# Patient Record
Sex: Female | Born: 1980 | Race: White | Hispanic: No | Marital: Married | Smoking: Never smoker
Health system: Southern US, Community
[De-identification: ages and names within clinical notes are randomized; demographics above are authoritative.]

## PROBLEM LIST (undated history)

## (undated) DIAGNOSIS — R519 Headache, unspecified: Secondary | ICD-10-CM

## (undated) DIAGNOSIS — F419 Anxiety disorder, unspecified: Secondary | ICD-10-CM

## (undated) DIAGNOSIS — M25859 Other specified joint disorders, unspecified hip: Secondary | ICD-10-CM

## (undated) DIAGNOSIS — R112 Nausea with vomiting, unspecified: Secondary | ICD-10-CM

## (undated) DIAGNOSIS — Z9889 Other specified postprocedural states: Secondary | ICD-10-CM

## (undated) DIAGNOSIS — J40 Bronchitis, not specified as acute or chronic: Secondary | ICD-10-CM

## (undated) DIAGNOSIS — R51 Headache: Secondary | ICD-10-CM

---

## 2017-02-20 DIAGNOSIS — G5601 Carpal tunnel syndrome, right upper limb: Secondary | ICD-10-CM

## 2017-02-20 HISTORY — PX: CARPAL TUNNEL RELEASE: SHX101

## 2017-02-20 HISTORY — DX: Carpal tunnel syndrome, right upper limb: G56.01

## 2017-11-01 ENCOUNTER — Other Ambulatory Visit: Payer: Self-pay | Admitting: Orthopedic Surgery

## 2017-11-01 DIAGNOSIS — M76892 Other specified enthesopathies of left lower limb, excluding foot: Secondary | ICD-10-CM

## 2017-11-16 ENCOUNTER — Ambulatory Visit
Admission: RE | Admit: 2017-11-16 | Discharge: 2017-11-16 | Disposition: A | Discharge status: Home / Self Care | Payer: BC Managed Care – PPO | Source: Ambulatory Visit | Attending: Orthopedic Surgery | Admitting: Orthopedic Surgery

## 2017-11-16 DIAGNOSIS — M76892 Other specified enthesopathies of left lower limb, excluding foot: Secondary | ICD-10-CM | POA: Diagnosis not present

## 2017-12-18 ENCOUNTER — Inpatient Hospital Stay: Admission: RE | Admit: 2017-12-18 | Payer: BC Managed Care – PPO | Source: Ambulatory Visit

## 2017-12-19 ENCOUNTER — Other Ambulatory Visit: Payer: Self-pay

## 2017-12-19 ENCOUNTER — Encounter
Admission: RE | Admit: 2017-12-19 | Discharge: 2017-12-19 | Disposition: A | Discharge status: Home / Self Care | Payer: BC Managed Care – PPO | Source: Ambulatory Visit | Attending: Orthopedic Surgery | Admitting: Orthopedic Surgery

## 2017-12-19 DIAGNOSIS — Z01812 Encounter for preprocedural laboratory examination: Secondary | ICD-10-CM | POA: Insufficient documentation

## 2017-12-19 HISTORY — DX: Other specified postprocedural states: R11.2

## 2017-12-19 HISTORY — DX: Headache, unspecified: R51.9

## 2017-12-19 HISTORY — DX: Other specified postprocedural states: Z98.890

## 2017-12-19 HISTORY — DX: Anxiety disorder, unspecified: F41.9

## 2017-12-19 HISTORY — DX: Headache: R51

## 2017-12-19 LAB — CBC
HCT: 42.1 % (ref 36.0–46.0)
Hemoglobin: 14.2 g/dL (ref 12.0–15.0)
MCH: 31.4 pg (ref 26.0–34.0)
MCHC: 33.7 g/dL (ref 30.0–36.0)
MCV: 93.1 fL (ref 80.0–100.0)
Platelets: 331 10*3/uL (ref 150–400)
RBC: 4.52 MIL/uL (ref 3.87–5.11)
RDW: 12 % (ref 11.5–15.5)
WBC: 6.8 10*3/uL (ref 4.0–10.5)
nRBC: 0 % (ref 0.0–0.2)

## 2017-12-19 NOTE — Patient Instructions (Signed)
Your procedure is scheduled on: Monday 12/24/17 Report to [place]. To find out your arrival time please call [phone] between 1PM - 3PM on Friday 12/21/17.  Remember: Instructions that are not followed completely may result in serious medical risk, up to and including death, or upon the discretion of your surgeon and anesthesiologist your surgery may need to be rescheduled.     _X__ 1. Do not eat food after midnight the night before your procedure.                 No gum chewing or hard candies. You may drink clear liquids up to 2 hours                 before you are scheduled to arrive for your surgery- DO not drink clear                 liquids within 2 hours of the start of your surgery.                 Clear Liquids include:  water, apple juice without pulp, clear carbohydrate                 drink such as Clearfast or Gatorade, Black Coffee or Tea (Do not add                 anything to coffee or tea).  __X__2.  On the morning of surgery brush your teeth with toothpaste and water, you                 may rinse your mouth with mouthwash if you wish.  Do not swallow any              toothpaste of mouthwash.     _X__ 3.  No Alcohol for 24 hours before or after surgery.   _X__ 4.  Do Not Smoke or use e-cigarettes For 24 Hours Prior to Your Surgery.                 Do not use any chewable tobacco products for at least 6 hours prior to                 surgery.  ____  5.  Bring all medications with you on the day of surgery if instructed.   __X__  6.  Notify your doctor if there is any change in your medical condition      (cold, fever, infections).     Do not wear jewelry, make-up, hairpins, clips or nail polish. Do not wear lotions, powders, or perfumes.  Do not shave 48 hours prior to surgery. Men may shave face and neck. Do not bring valuables to the hospital.    [facility] is not responsible for any belongings or  valuables.  Contacts, dentures/partials or body piercings may not be worn into surgery. Bring a case for your contacts, glasses or hearing aids, a denture cup will be supplied. Leave your suitcase in the car. After surgery it may be brought to your room. For patients admitted to the hospital, discharge time is determined by your treatment team.   Patients discharged the day of surgery will not be allowed to drive home.   Please read over the following fact sheets that you were given:   MRSA Information  __X__ Take these medicines the morning of surgery with A SIP OF WATER:  1. cetirizine (ZYRTEC)  2.   3.   4.  5.  6.  ____ Fleet Enema (as directed)   __X__ Use CHG Soap/SAGE wipes as directed  ____ Use inhalers on the day of surgery  ____ Stop metformin/Janumet/Farxiga 2 days prior to surgery    ____ Take 1/2 of usual insulin dose the night before surgery. No insulin the morning          of surgery.   ____ Stop Blood Thinners Coumadin/Plavix/Xarelto/Pleta/Pradaxa/Eliquis/Effient/Aspirin  on   Or contact your Surgeon, Cardiologist or Medical Doctor regarding  ability to stop your blood thinners  __X__ Stop Anti-inflammatories 7 days before surgery such as Advil, Ibuprofen, Motrin,  BC or Goodies Powder, Naprosyn, Naproxen, Aleve, Aspirin (you may take tylenol if needed)   __X__ Stop all herbal supplements, fish oil or vitamin E until after surgery.    ____ Bring C-Pap to the hospital.

## 2017-12-23 MED ORDER — CEFAZOLIN SODIUM-DEXTROSE 2-4 GM/100ML-% IV SOLN
2.0000 g | Freq: Once | INTRAVENOUS | Status: AC
  Administered 2017-12-24 (×2): 2 g via INTRAVENOUS

## 2017-12-24 ENCOUNTER — Encounter
Admission: RE | Disposition: A | Discharge status: Home / Self Care | Payer: Self-pay | Source: Ambulatory Visit | Attending: Orthopedic Surgery

## 2017-12-24 ENCOUNTER — Encounter: Payer: Self-pay | Admitting: Emergency Medicine

## 2017-12-24 ENCOUNTER — Observation Stay
Admission: RE | Admit: 2017-12-24 | Discharge: 2017-12-25 | Disposition: A | Discharge status: Home / Self Care | Payer: BC Managed Care – PPO | Source: Ambulatory Visit | Attending: Orthopedic Surgery | Admitting: Orthopedic Surgery

## 2017-12-24 ENCOUNTER — Ambulatory Visit: Payer: BC Managed Care – PPO | Admitting: Certified Registered"

## 2017-12-24 ENCOUNTER — Observation Stay: Payer: BC Managed Care – PPO

## 2017-12-24 ENCOUNTER — Other Ambulatory Visit: Payer: Self-pay

## 2017-12-24 DIAGNOSIS — X58XXXA Exposure to other specified factors, initial encounter: Secondary | ICD-10-CM | POA: Diagnosis not present

## 2017-12-24 DIAGNOSIS — Z7982 Long term (current) use of aspirin: Secondary | ICD-10-CM | POA: Diagnosis not present

## 2017-12-24 DIAGNOSIS — M769 Unspecified enthesopathy, lower limb, excluding foot: Secondary | ICD-10-CM | POA: Insufficient documentation

## 2017-12-24 DIAGNOSIS — M25859 Other specified joint disorders, unspecified hip: Secondary | ICD-10-CM | POA: Diagnosis present

## 2017-12-24 DIAGNOSIS — Z419 Encounter for procedure for purposes other than remedying health state, unspecified: Secondary | ICD-10-CM

## 2017-12-24 DIAGNOSIS — M25852 Other specified joint disorders, left hip: Secondary | ICD-10-CM | POA: Insufficient documentation

## 2017-12-24 DIAGNOSIS — S73192A Other sprain of left hip, initial encounter: Principal | ICD-10-CM | POA: Insufficient documentation

## 2017-12-24 DIAGNOSIS — Z79899 Other long term (current) drug therapy: Secondary | ICD-10-CM | POA: Diagnosis not present

## 2017-12-24 HISTORY — PX: HIP ARTHROSCOPY: SHX668

## 2017-12-24 LAB — POCT PREGNANCY, URINE: Preg Test, Ur: NEGATIVE

## 2017-12-24 SURGERY — ARTHROSCOPY HIP
Anesthesia: General | Site: Hip | Laterality: Left

## 2017-12-24 MED ORDER — LIDOCAINE HCL (PF) 1 % IJ SOLN
INTRAMUSCULAR | Status: DC | PRN
  Administered 2017-12-24: 20 mL

## 2017-12-24 MED ORDER — SODIUM CHLORIDE 0.9 % IV SOLN
INTRAVENOUS | Status: DC
  Administered 2017-12-24 – 2017-12-25 (×2): via INTRAVENOUS

## 2017-12-24 MED ORDER — SCOPOLAMINE 1 MG/3DAYS TD PT72
1.0000 | MEDICATED_PATCH | Freq: Once | TRANSDERMAL | Status: DC
  Administered 2017-12-24: 1.5 mg via TRANSDERMAL

## 2017-12-24 MED ORDER — ADULT MULTIVITAMIN W/MINERALS CH
1.0000 | ORAL_TABLET | Freq: Every day | ORAL | Status: DC
  Administered 2017-12-25: 1 via ORAL
  Filled 2017-12-24: qty 1

## 2017-12-24 MED ORDER — NAPROXEN 500 MG PO TABS
500.0000 mg | ORAL_TABLET | Freq: Two times a day (BID) | ORAL | Status: DC
  Administered 2017-12-25: 500 mg via ORAL
  Filled 2017-12-24 (×2): qty 1

## 2017-12-24 MED ORDER — ACETAMINOPHEN 500 MG PO TABS
ORAL_TABLET | ORAL | Status: AC
  Administered 2017-12-24: 1000 mg via ORAL
  Filled 2017-12-24: qty 2

## 2017-12-24 MED ORDER — MORPHINE SULFATE (PF) 4 MG/ML IV SOLN
INTRAVENOUS | Status: AC
  Filled 2017-12-24: qty 1

## 2017-12-24 MED ORDER — TIZANIDINE HCL 4 MG PO TABS
8.0000 mg | ORAL_TABLET | Freq: Four times a day (QID) | ORAL | Status: DC | PRN
  Filled 2017-12-24: qty 2

## 2017-12-24 MED ORDER — TRAMADOL HCL 50 MG PO TABS
50.0000 mg | ORAL_TABLET | Freq: Four times a day (QID) | ORAL | Status: DC | PRN
  Administered 2017-12-24 (×2): 50 mg via ORAL
  Filled 2017-12-24 (×2): qty 1

## 2017-12-24 MED ORDER — MORPHINE SULFATE (PF) 4 MG/ML IV SOLN
INTRAVENOUS | Status: DC | PRN
  Administered 2017-12-24: 4 mg

## 2017-12-24 MED ORDER — DIPHENHYDRAMINE HCL 12.5 MG/5ML PO ELIX: 12.5000 mg | ORAL_SOLUTION | ORAL | Status: DC | PRN

## 2017-12-24 MED ORDER — ONDANSETRON HCL 4 MG/2ML IJ SOLN
INTRAMUSCULAR | Status: DC | PRN
  Administered 2017-12-24: 4 mg via INTRAVENOUS

## 2017-12-24 MED ORDER — ROCURONIUM BROMIDE 50 MG/5ML IV SOLN
INTRAVENOUS | Status: AC
  Filled 2017-12-24: qty 1

## 2017-12-24 MED ORDER — SCOPOLAMINE 1 MG/3DAYS TD PT72
MEDICATED_PATCH | TRANSDERMAL | Status: AC
  Administered 2017-12-24: 1.5 mg via TRANSDERMAL
  Filled 2017-12-24: qty 1

## 2017-12-24 MED ORDER — ONDANSETRON HCL 4 MG PO TABS: 4.0000 mg | ORAL_TABLET | Freq: Four times a day (QID) | ORAL | Status: DC | PRN

## 2017-12-24 MED ORDER — GLYCOPYRROLATE 0.2 MG/ML IJ SOLN
INTRAMUSCULAR | Status: AC
  Filled 2017-12-24: qty 1

## 2017-12-24 MED ORDER — GABAPENTIN 300 MG PO CAPS
300.0000 mg | ORAL_CAPSULE | Freq: Once | ORAL | Status: AC
  Administered 2017-12-24: 300 mg via ORAL

## 2017-12-24 MED ORDER — PANTOPRAZOLE SODIUM 20 MG PO TBEC
20.0000 mg | DELAYED_RELEASE_TABLET | Freq: Every day | ORAL | Status: DC
  Administered 2017-12-25: 20 mg via ORAL
  Filled 2017-12-24: qty 1

## 2017-12-24 MED ORDER — ONDANSETRON HCL 4 MG/2ML IJ SOLN
INTRAMUSCULAR | Status: AC
  Filled 2017-12-24: qty 2

## 2017-12-24 MED ORDER — LIDOCAINE HCL (CARDIAC) PF 100 MG/5ML IV SOSY
PREFILLED_SYRINGE | INTRAVENOUS | Status: DC | PRN
  Administered 2017-12-24: 50 mg via INTRAVENOUS

## 2017-12-24 MED ORDER — DEXAMETHASONE SODIUM PHOSPHATE 10 MG/ML IJ SOLN
INTRAMUSCULAR | Status: AC
  Filled 2017-12-24: qty 1

## 2017-12-24 MED ORDER — FENTANYL CITRATE (PF) 250 MCG/5ML IJ SOLN
INTRAMUSCULAR | Status: AC
  Filled 2017-12-24: qty 5

## 2017-12-24 MED ORDER — MIDAZOLAM HCL 2 MG/2ML IJ SOLN
INTRAMUSCULAR | Status: AC
  Filled 2017-12-24: qty 2

## 2017-12-24 MED ORDER — SENNOSIDES-DOCUSATE SODIUM 8.6-50 MG PO TABS: 1.0000 | ORAL_TABLET | Freq: Every evening | ORAL | Status: DC | PRN

## 2017-12-24 MED ORDER — OXYCODONE HCL 5 MG PO TABS
10.0000 mg | ORAL_TABLET | ORAL | Status: DC | PRN
  Administered 2017-12-25: 10 mg via ORAL
  Filled 2017-12-24 (×2): qty 2

## 2017-12-24 MED ORDER — PHENYLEPHRINE HCL 10 MG/ML IJ SOLN
INTRAMUSCULAR | Status: AC
  Filled 2017-12-24: qty 1

## 2017-12-24 MED ORDER — HYDROMORPHONE HCL 1 MG/ML IJ SOLN: 0.5000 mg | INTRAMUSCULAR | Status: DC | PRN

## 2017-12-24 MED ORDER — KETOROLAC TROMETHAMINE 15 MG/ML IJ SOLN
15.0000 mg | Freq: Once | INTRAMUSCULAR | Status: AC
  Administered 2017-12-24: 15 mg via INTRAVENOUS
  Filled 2017-12-24: qty 1

## 2017-12-24 MED ORDER — GABAPENTIN 300 MG PO CAPS
ORAL_CAPSULE | ORAL | Status: AC
  Administered 2017-12-24: 300 mg via ORAL
  Filled 2017-12-24: qty 1

## 2017-12-24 MED ORDER — CEFAZOLIN SODIUM-DEXTROSE 2-4 GM/100ML-% IV SOLN
INTRAVENOUS | Status: AC
  Filled 2017-12-24: qty 100

## 2017-12-24 MED ORDER — LACTATED RINGERS IV SOLN
INTRAVENOUS | Status: DC
  Administered 2017-12-24 (×2): via INTRAVENOUS

## 2017-12-24 MED ORDER — METHOCARBAMOL 500 MG PO TABS
ORAL_TABLET | ORAL | Status: AC
  Administered 2017-12-24: 1000 mg via ORAL
  Filled 2017-12-24: qty 2

## 2017-12-24 MED ORDER — CELECOXIB 200 MG PO CAPS
200.0000 mg | ORAL_CAPSULE | Freq: Once | ORAL | Status: AC
  Administered 2017-12-24: 200 mg via ORAL

## 2017-12-24 MED ORDER — ONDANSETRON HCL 4 MG/2ML IJ SOLN: 4.0000 mg | Freq: Once | INTRAMUSCULAR | Status: DC | PRN

## 2017-12-24 MED ORDER — PHENYLEPHRINE HCL 10 MG/ML IJ SOLN
INTRAMUSCULAR | Status: DC | PRN
  Administered 2017-12-24: 100 ug via INTRAVENOUS
  Administered 2017-12-24: 50 ug via INTRAVENOUS
  Administered 2017-12-24: 100 ug via INTRAVENOUS

## 2017-12-24 MED ORDER — CEFAZOLIN SODIUM-DEXTROSE 2-4 GM/100ML-% IV SOLN
2.0000 g | Freq: Four times a day (QID) | INTRAVENOUS | Status: AC
  Administered 2017-12-24 – 2017-12-25 (×2): 2 g via INTRAVENOUS
  Filled 2017-12-24 (×2): qty 100

## 2017-12-24 MED ORDER — DOCUSATE SODIUM 100 MG PO CAPS
100.0000 mg | ORAL_CAPSULE | Freq: Two times a day (BID) | ORAL | Status: DC
  Administered 2017-12-24 – 2017-12-25 (×2): 100 mg via ORAL
  Filled 2017-12-24 (×2): qty 1

## 2017-12-24 MED ORDER — FAMOTIDINE 20 MG PO TABS
20.0000 mg | ORAL_TABLET | Freq: Once | ORAL | Status: AC
  Administered 2017-12-24: 20 mg via ORAL

## 2017-12-24 MED ORDER — CELECOXIB 200 MG PO CAPS
ORAL_CAPSULE | ORAL | Status: AC
  Administered 2017-12-24: 200 mg via ORAL
  Filled 2017-12-24: qty 1

## 2017-12-24 MED ORDER — ROPIVACAINE HCL 5 MG/ML IJ SOLN
INTRAMUSCULAR | Status: AC
  Filled 2017-12-24: qty 20

## 2017-12-24 MED ORDER — LIDOCAINE HCL (PF) 2 % IJ SOLN
INTRAMUSCULAR | Status: AC
  Filled 2017-12-24: qty 10

## 2017-12-24 MED ORDER — MIDAZOLAM HCL 2 MG/2ML IJ SOLN
INTRAMUSCULAR | Status: DC | PRN
  Administered 2017-12-24: 2 mg via INTRAVENOUS

## 2017-12-24 MED ORDER — FENTANYL CITRATE (PF) 100 MCG/2ML IJ SOLN
25.0000 ug | INTRAMUSCULAR | Status: DC | PRN
  Administered 2017-12-24: 25 ug via INTRAVENOUS

## 2017-12-24 MED ORDER — PROPOFOL 10 MG/ML IV BOLUS
INTRAVENOUS | Status: DC | PRN
  Administered 2017-12-24: 150 mg via INTRAVENOUS

## 2017-12-24 MED ORDER — KETOROLAC TROMETHAMINE 15 MG/ML IJ SOLN
INTRAMUSCULAR | Status: AC
  Filled 2017-12-24: qty 1

## 2017-12-24 MED ORDER — OXYCODONE HCL 5 MG PO TABS
5.0000 mg | ORAL_TABLET | ORAL | Status: DC | PRN
  Administered 2017-12-24 – 2017-12-25 (×2): 5 mg via ORAL
  Filled 2017-12-24 (×2): qty 1

## 2017-12-24 MED ORDER — EPHEDRINE SULFATE 50 MG/ML IJ SOLN
INTRAMUSCULAR | Status: AC
  Filled 2017-12-24: qty 1

## 2017-12-24 MED ORDER — ROCURONIUM BROMIDE 100 MG/10ML IV SOLN
INTRAVENOUS | Status: DC | PRN
  Administered 2017-12-24: 50 mg via INTRAVENOUS
  Administered 2017-12-24: 10 mg via INTRAVENOUS

## 2017-12-24 MED ORDER — EPHEDRINE SULFATE 50 MG/ML IJ SOLN
INTRAMUSCULAR | Status: DC | PRN
  Administered 2017-12-24: 10 mg via INTRAVENOUS

## 2017-12-24 MED ORDER — FENTANYL CITRATE (PF) 100 MCG/2ML IJ SOLN
INTRAMUSCULAR | Status: AC
  Filled 2017-12-24: qty 2

## 2017-12-24 MED ORDER — ACETAMINOPHEN 500 MG PO TABS
1000.0000 mg | ORAL_TABLET | Freq: Once | ORAL | Status: AC
  Administered 2017-12-24: 1000 mg via ORAL

## 2017-12-24 MED ORDER — PROPOFOL 10 MG/ML IV BOLUS
INTRAVENOUS | Status: AC
  Filled 2017-12-24: qty 20

## 2017-12-24 MED ORDER — CEFAZOLIN SODIUM 1 G IJ SOLR
INTRAMUSCULAR | Status: AC
  Filled 2017-12-24: qty 20

## 2017-12-24 MED ORDER — EPINEPHRINE 30 MG/30ML IJ SOLN
INTRAMUSCULAR | Status: AC
  Filled 2017-12-24: qty 1

## 2017-12-24 MED ORDER — METHOCARBAMOL 500 MG PO TABS
1000.0000 mg | ORAL_TABLET | Freq: Once | ORAL | Status: AC
  Administered 2017-12-24: 1000 mg via ORAL
  Filled 2017-12-24: qty 2

## 2017-12-24 MED ORDER — GLYCOPYRROLATE 0.2 MG/ML IJ SOLN
INTRAMUSCULAR | Status: DC | PRN
  Administered 2017-12-24: 0.2 mg via INTRAVENOUS

## 2017-12-24 MED ORDER — FENTANYL CITRATE (PF) 100 MCG/2ML IJ SOLN
INTRAMUSCULAR | Status: DC | PRN
  Administered 2017-12-24 (×4): 50 ug via INTRAVENOUS

## 2017-12-24 MED ORDER — SERTRALINE HCL 50 MG PO TABS
25.0000 mg | ORAL_TABLET | Freq: Every day | ORAL | Status: DC
  Administered 2017-12-24: 25 mg via ORAL
  Filled 2017-12-24: qty 1

## 2017-12-24 MED ORDER — ROPIVACAINE HCL 5 MG/ML IJ SOLN
INTRAMUSCULAR | Status: DC | PRN
  Administered 2017-12-24: 20 mL

## 2017-12-24 MED ORDER — ONDANSETRON HCL 4 MG/2ML IJ SOLN: 4.0000 mg | Freq: Four times a day (QID) | INTRAMUSCULAR | Status: DC | PRN

## 2017-12-24 MED ORDER — LORATADINE 10 MG PO TABS
10.0000 mg | ORAL_TABLET | Freq: Every day | ORAL | Status: DC
  Administered 2017-12-25: 10 mg via ORAL
  Filled 2017-12-24: qty 1

## 2017-12-24 MED ORDER — FAMOTIDINE 20 MG PO TABS
ORAL_TABLET | ORAL | Status: AC
  Administered 2017-12-24: 20 mg via ORAL
  Filled 2017-12-24: qty 1

## 2017-12-24 MED ORDER — ACETAMINOPHEN 500 MG PO TABS
1000.0000 mg | ORAL_TABLET | Freq: Three times a day (TID) | ORAL | Status: DC
  Administered 2017-12-24: 1000 mg via ORAL
  Filled 2017-12-24: qty 2

## 2017-12-24 MED ORDER — ASPIRIN EC 325 MG PO TBEC
325.0000 mg | DELAYED_RELEASE_TABLET | Freq: Every day | ORAL | Status: DC
  Administered 2017-12-25: 325 mg via ORAL
  Filled 2017-12-24: qty 1

## 2017-12-24 MED ORDER — DEXAMETHASONE SODIUM PHOSPHATE 10 MG/ML IJ SOLN
INTRAMUSCULAR | Status: DC | PRN
  Administered 2017-12-24 (×2): 5 mg via INTRAVENOUS

## 2017-12-24 MED ORDER — EPINEPHRINE 30 MG/30ML IJ SOLN
INTRAMUSCULAR | Status: DC | PRN
  Administered 2017-12-24: 34 mg

## 2017-12-24 MED ORDER — LIDOCAINE HCL (PF) 1 % IJ SOLN
INTRAMUSCULAR | Status: AC
  Filled 2017-12-24: qty 30

## 2017-12-24 SURGICAL SUPPLY — 63 items
50 degree wand ×2 IMPLANT
ADAPTER IRRIG TUBE 2 SPIKE SOL (ADAPTER) ×4 IMPLANT
ANCHOR SUT 2.4 SS KNTLS #1 (Anchor) ×6 IMPLANT
BIT DRILL SS CINCHLOCK (BIT) ×2 IMPLANT
BLADE SAMURAI STR FULL RADIUS (BLADE) ×2 IMPLANT
BLADE SURG SZ11 CARB STEEL (BLADE) ×2 IMPLANT
BNDG ADH 2 X3.75 FABRIC TAN LF (GAUZE/BANDAGES/DRESSINGS) ×10 IMPLANT
BUR 4.0 ROUND XL DIAMOND (BUR) IMPLANT
BUR 5.5 ROUND LONG FS 8 FLUTE (BUR) IMPLANT
CANNULA 8 123 TRANSPORT (CANNULA) ×2 IMPLANT
CANNULA 8 456 TRANSPORT (CANNULA) ×2 IMPLANT
CANNULA 8 789 TRANSPORT (CANNULA) ×2 IMPLANT
CANNULA OBTURATOR FLOWPORT (CANNULA) ×2 IMPLANT
CHLORAPREP W/TINT 26ML (MISCELLANEOUS) ×2 IMPLANT
COVER WAND RF STERILE (DRAPES) ×2 IMPLANT
CRADLE LAMINECT ARM (MISCELLANEOUS) ×2 IMPLANT
CUTTER AGGRESSIVE PLUS 4D 180L (CUTTER) IMPLANT
DEVICE SUCT BLK HOLE OR FLOOR (MISCELLANEOUS) ×2 IMPLANT
DRAPE C-ARM 42X72 X-RAY (DRAPES) ×2 IMPLANT
DRAPE SHEET LG 3/4 BI-LAMINATE (DRAPES) IMPLANT
DRAPE SURG 17X11 SM STRL (DRAPES) ×2 IMPLANT
GAUZE SPONGE 4X4 12PLY STRL (GAUZE/BANDAGES/DRESSINGS) ×2 IMPLANT
GLOVE BIOGEL PI IND STRL 8 (GLOVE) ×1 IMPLANT
GLOVE BIOGEL PI INDICATOR 8 (GLOVE) ×1
GLOVE SURG ORTHO 8.0 STRL STRW (GLOVE) ×4 IMPLANT
GOWN STRL REUS W/ TWL LRG LVL3 (GOWN DISPOSABLE) ×1 IMPLANT
GOWN STRL REUS W/ TWL XL LVL3 (GOWN DISPOSABLE) ×1 IMPLANT
GOWN STRL REUS W/TWL LRG LVL3 (GOWN DISPOSABLE) ×1
GOWN STRL REUS W/TWL XL LVL3 (GOWN DISPOSABLE) ×1
IV LACTATED RINGER IRRG 3000ML (IV SOLUTION) ×34
IV LR IRRIG 3000ML ARTHROMATIC (IV SOLUTION) ×34 IMPLANT
KIT PATIENT POSITION MEDIUM (KITS) ×2 IMPLANT
KIT PORTAL ENTRY HIP ACCESS (KITS) ×2 IMPLANT
KIT TURNOVER CYSTO (KITS) ×2 IMPLANT
MANIFOLD NEPTUNE II (INSTRUMENTS) ×2 IMPLANT
MAT ABSORB  FLUID 56X50 GRAY (MISCELLANEOUS) ×1
MAT ABSORB FLUID 56X50 GRAY (MISCELLANEOUS) ×1 IMPLANT
NDL SAFETY ECLIPSE 18X1.5 (NEEDLE) ×1 IMPLANT
NEEDLE HYPO 18GX1.5 SHARP (NEEDLE) ×1
NEEDLE INJECTOR II CARTRIDGE (MISCELLANEOUS) ×2 IMPLANT
PACK ARTHROSCOPY KNEE (MISCELLANEOUS) ×4 IMPLANT
PACK UNIVERSAL (MISCELLANEOUS) ×2 IMPLANT
PAD ABD DERMACEA PRESS 5X9 (GAUZE/BANDAGES/DRESSINGS) ×2 IMPLANT
PAD PREP 24X41 OB/GYN DISP (PERSONAL CARE ITEMS) ×2 IMPLANT
PASSER SUT 1.5D CRESCENT (INSTRUMENTS) ×4 IMPLANT
PASSER SUT 70D UP ANGLED (INSTRUMENTS) ×2 IMPLANT
SERFAS 50-S SWEEP XL (INSTRUMENTS)
SET TUBE SUCT SHAVER OUTFL 24K (TUBING) IMPLANT
SUT ETHILON 3-0 FS-10 30 BLK (SUTURE) ×2
SUT FORCE FIBER 2 38IN K BLUE (SUTURE) ×2
SUT VIC AB 2-0 CT2 27 (SUTURE) ×2 IMPLANT
SUT ZIPLINE SZ2 BLK (SUTURE) ×10 IMPLANT
SUT ZIPLINE SZ2 GREEN (SUTURE) ×14 IMPLANT
SUTURE EHLN 3-0 FS-10 30 BLK (SUTURE) ×1 IMPLANT
SUTURE FORCE FIBER 2 38IN K BL (SUTURE) ×1 IMPLANT
SUTURE TAPE XBRAID 1.2 BLUE 45 (SUTURE) ×1 IMPLANT
SUTURETAPE XBRAID 1.2 BLUE 45 (SUTURE) ×2
TRAY FOLEY SLVR 16FR LF STAT (SET/KITS/TRAYS/PACK) ×2 IMPLANT
TUBING ARTHRO INFLOW-ONLY STRL (TUBING) IMPLANT
WAND SERFAS 50-S SWEEP XL (INSTRUMENTS) IMPLANT
gator shaver ×2 IMPLANT
polishing bur ×2 IMPLANT
spherical bur ×2 IMPLANT

## 2017-12-24 NOTE — Progress Notes (Signed)
Resting comfortably after hip arthroscopy earlier today. Pain controlled with tramadol currently. She has had prior R hand injury with median nerve symptoms intermittently. She states she is having some numbness in her fingertips of the R hand.   Exam: Bilat LE: 5/5 DF/PF/EHL SILT s/s/t/sp/dp distr of feet and over medial thighs and legs Feet wwp Compartments soft and non-tender  RUE: +ain/pin/u motor SILT r/u/ax. Mild numbness in median nerve distr. No forearm numbness +rad pulse    POSTOP PLAN: - Monitor RUE numbness sensations, likely neuropraxia and should self resolve - PT/OT on POD#1 - FFWB on operative extremity - Ancef q6h x 24 hours - DVT ppx: ASA 325mg /day starting on POD#1  - Pain control: Tylenol scheduled + tramadol/oxycodone PO prn + dilaudid iv for breakthrough

## 2017-12-24 NOTE — Op Note (Signed)
Operative Note   SURGERY DATE: 12/24/2017  PRE-OP DIAGNOSIS:  1. Left femoroacetabular impingement 2. Left hip labral tear  POST-OP DIAGNOSIS: 1. Left femoroacetabular impingement 2. Left hip labral tear  PROCEDURES:  1. Left hip arthroscopy with acetabuloplasty, labral repair, femoral osteochondroplasty, and capsular closure  SURGEON: Rosealee Albee, MD  ASSISTANT: Dedra Skeens, PA  ANESTHESIA: Gen  ESTIMATED BLOOD LOSS:minimal  TOTAL IV FLUIDS: per anesthesia  INDICATION(S): The patient is a 37 y.o. year old female who presents with persistent hip pain.  Radiographs demonstrated FAI morphology and the MRI revealed a labral tear.  She has failed greater than 3 months of non-operative treatment to date including activity modifications, physical therapy, and corticosteroid injection.  Please see the preoperative notes for further detail.   She elected to undergo the above mentioned procedure after detailed explanation of the expected outcomes and recovery path.   Informed consent was obtained outlining the expected benefits and possible risks of the surgery including a less than 5% chance of numbness in the sciatic or pudendal nerve regions, 20% chance of injury to the lateral femoral cutaneous nerve (1% permanent injury). Other risks include continued pain, nd other general risks of surgery such as blood clots, infection and bleeding.  OPERATIVE FINDINGS: Cartilage No significant degenerative changes of the acetabular cartilage High grade cartilage lesion: no Delamination: no Bone exposed: no Bruising: no Localization of femoral head high grade lesion: none  Cotyloid fossa osteophytes:  none The remainder of the femoral and acetabular cartilage was normal.  Labrum Labral degeneration, yellow over 50% of labrum: no Complexity of tearing:  Tear at chondrolabral junction (2-3 o'clock) and wave sign (12-2 o'clock) Hypoplastic labrum: no Hyperplastic labrum:   no Lipstick sign at the psoas prominence: none Psoas Prominence: no  Boundaries of labral tear Convention (3 o'clock anterior, 9 o'clock posterior) Anterior boundary: 3 o'clock Posterior boundary: 12 o'clock  Ligamentum teres Hypertrophy:  no Tear: no   OPERATIVE REPORT:  The patient was brought to the operating room, placed supine on the operating table, and bony prominences were padded.  The traction boots were applied with padding to ensure that safe traction could be applied through the feet.  The contralateral limb was abducted slightly and light traction was applied.  The operative leg was brought into neutral position.  Appropriate preoperative IV antibiotics were administered. The patient was prepped and draped in a sterile fashion.  Time-out was performed and landmarks were identified. An air arthrogram was obtained by injecting 30cc into the hip joint while traction was pulled. This broke the labral seal allowing for distraction of the hip. Care was taken to ensure the least amount of force necessary to allow safe access to the joint of 8-45mm.  This was checked with fluoroscopy.   Next we placed an anterolateral portal under the assistance of fluoroscopy.  First, fluoroscopy was used to estimate the trajectory and starting point.  A 5mm incision with a #11 blade was made and a straight hemostat was used to dilate the portal through the appropriate tract.  We then placed a 14-gauge hypodermic needle with careful technique to be as close to the femoral head as possible and parallel to the sourcil to ensure no iatrogenic damage to the labrum.  This released the negative pressure environment and the amount of traction was adjusted to maintain the 8-57mm of distraction.  A nitinol wire was placed through the needle and flouroscopy was used to ensure it extended to the medial wall of the  acetabulum.  The Flowport from [employer] was placed over the wire and the nitinol wire was  retracted to just inside the capsule during insertion of the dilator and cannula to minimize the risk of breakage. The arthroscope was placed next and we visualized the anterior triangle.  We then placed the anterior portal under direct visualization using the technique described above.  This was safely placed as well without damage to the labrum or femoral head.  We then switched our arthroscope to the anterior portal to ensure we were not through the labrum - we were safely through the capsule only.  We then proceeded with a transverse capsulotomy connecting the 2 portals in the same plane utilizing the Samurai blade from [employer].  The Injector device from [employer] was used to place traction stitches each in the medial and lateral arms of the proximal capsule.  A Kelly clamp was used to hold the suture against the skin to apply traction. This allowed access to the acetabular rim and labrum as well as protection of the native edges of the capsule.  We identified the anterior inferior iliac spine proximally, the psoas tendon medially and the rectus tendon laterally as landmarks.  We then proceeded with a diagnostic arthroscopy - the results can be found in the findings section above.    Traction was released and the hip was flexed. We then used the 50 degree hip specific radiofrequency device and a 4mm shaver to clear the superior acetabulum and expose the subspinous region.  Next we exposed the acetabular rim leaving the chondral labral junction intact.  Working from both portals, the acetabular rim/subspinous region was reshaped with a 4.47mm diamond burr consistent with the preoperative three-dimensional imaging.   Traction was applied to the hip once again for orientation purposes. Acetabuloplasty was extended slightly more medially to match region of labral tearing. When adequate reshaping was confirmed with fluoroscopy, we then proceeded with the labral repair.  A distal anterolateral portal was  placed under direct visualization and the Transport cannula was inserted.  Care was taken to ensure the cannula was in the intermuscular plane between the gluteus minimus and iliocapsularis.  This portal was approximately 4cm distal and 1cm anterior to the anterolateral portal.     We placed 3 anchors at the 12:30, 1:30, and 2:30 positions with a vertical mattress stitch at 12:30 and 2:30. A simple stitch was placed at 1:30 due to some intrasubstance labral degeneration. The sutures were passed using the crescent Nanopass from [employer].  This resulted in anatomic labral repair.  We debrided the loose cartilage at the rim and residual degenerative labral tissue.  Traction was let down with total traction time of 108 minutes (40 min + 68 min with 43 minutes without traction in between).    We then turned our attention to the peripheral compartment.  We flexed the hip to 45 degrees. We viewed from the anterior portal and worked from the distal anterolateral portal.  First we passed two traction stitches in the medial and lateral sides of the capsulotomy.  In between these sutures, we performed a T capsulotomy with the samurai blade from [employer] down to the intertrochanteric line in the plane between the iliocapsularis and gluteus minimus. The position of the T-capsulotomy was checked with fluoroscopy to ensure a safe position along the anterolateral neck.  Next we placed one additional traction stitch in the lateral limb of the T-capsulotomy.  Clamps were used to hold these stitches  against the skin for retraction.  Adequate mobilization and retraction was achieved such that we could view the entire CAM lesion. The capsule was retracted with a switching stick through the AL portal when necessary.  We were able to view the medial and lateral synovial folds, identifying the medial and lateral circumflex arteries.  These were protected during the osteoplasty.  A 5.48mm burr was used to reshape the femoral  neck.  The initial line of resection was defined with the use of dynamic exam and fluoroscopy.  The line was parallel to the acetabular rim with the leg in neutral rotation.  We viewed the base of the femoral neck to provide a foundation for the shape and size of the osteochondroplasty. We were able to adequately remove the cam lesion and reshape the femoral neck.  This was confirmed with fluoroscopy.  Dynamic exam showed no residual impingement flexed to 90 degrees with maximal internal rotation and external rotation.  Finally, we performed a complete capsular closure with Zipline suture from [employer], utilizing the Slingshot from [employer] to pass the suture.  Five simple stitches (two in the vertical limb of the T-capsulotomy, one medial, one lateral) and one figure of 8 stitch (in between the medial and lateral interportal stitches capturing the apex of the T capsulotomy) were placed for capsular closure: These were then tied sequentially with alternating half hitches through an 8.37mm Transport cannula. Reapproximation of the capsule was confirmed.   We then removed the arthroscope and closed the incisions with 2-0 Vicryl subdermally and 3-0 nylon stitches.  4mg  morphine was injected into the joint and local anesthetic was injected about the portals and tracts down to the joint. A sterile dressing was applied and hip brace was applied.  The patient was awakened from anesthesia and transferred to PACU in stable condition.   POST-OPERATIVE PLAN: Postoperative care includes overnight stay, 3 weeks of flat-foot/touch down weight-bearing and continuous passive motion device for 6-8 hours per day.  The patient will require a brace for 3 weeks locked at 50 degrees while sleeping and open to 90 degrees of flexion at all other times.  Formal physical therapy will begin this week. ASA x 2 weeks for DVT ppx. Naproxen for HO ppx for 30 days.

## 2017-12-24 NOTE — Anesthesia Preprocedure Evaluation (Signed)
Anesthesia Evaluation  Patient identified by MRN, date of birth, ID band Patient awake    Reviewed: Allergy & Precautions, H&P , NPO status , Patient's Chart, lab work & pertinent test results, reviewed documented beta blocker date and time   History of Anesthesia Complications (+) PONV and history of anesthetic complications  Airway Mallampati: II  TM Distance: >3 FB Neck ROM: full    Dental  (+) Teeth Intact   Pulmonary neg pulmonary ROS,    Pulmonary exam normal        Cardiovascular Exercise Tolerance: Good negative cardio ROS Normal cardiovascular exam Rhythm:regular Rate:Normal     Neuro/Psych  Headaches, Anxiety negative psych ROS   GI/Hepatic negative GI ROS, Neg liver ROS,   Endo/Other  negative endocrine ROS  Renal/GU negative Renal ROS  negative genitourinary   Musculoskeletal   Abdominal   Peds  Hematology negative hematology ROS (+)   Anesthesia Other Findings Past Medical History: No date: Anxiety No date: Headache No date: PONV (postoperative nausea and vomiting) Past Surgical History: No date: CESAREAN SECTION BMI    Body Mass Index:  22.86 kg/m     Reproductive/Obstetrics negative OB ROS                             Anesthesia Physical Anesthesia Plan  ASA: II  Anesthesia Plan: General ETT   Post-op Pain Management:    Induction:   PONV Risk Score and Plan:   Airway Management Planned:   Additional Equipment:   Intra-op Plan:   Post-operative Plan:   Informed Consent: I have reviewed the patients History and Physical, chart, labs and discussed the procedure including the risks, benefits and alternatives for the proposed anesthesia with the patient or authorized representative who has indicated his/her understanding and acceptance.   Dental Advisory Given  Plan Discussed with: CRNA  Anesthesia Plan Comments:         Anesthesia Quick  Evaluation

## 2017-12-24 NOTE — Anesthesia Post-op Follow-up Note (Signed)
Anesthesia QCDR form completed.        

## 2017-12-24 NOTE — Progress Notes (Signed)
Received call from Dr. Allena Katz. Instructed to give one time dose ketorolac 15mg  and 100mg  methocarbamol in PACU. Order released.

## 2017-12-24 NOTE — H&P (Signed)
Paper H&P to be scanned into permanent record. H&P reviewed. No significant changes noted.  

## 2017-12-24 NOTE — Discharge Instructions (Signed)
Hip Arthroscopy Post-Operative Instructions  1. Physical Therapy should start within 3-4 days of surgery. 2. If oozing from surgery site occurs, and the dressing appears soaked with bloody fluid please change the dressing as needed. This normally occurs after fluid irrigation during surgery, and will resolve within 24-36 hours. 3. Icing is very important for the first 5-7 days postoperative, and ice is applied (ice packs or ice therapy) as often as possible or at least for 20-minute periods 3-4 times per day. Ice should not be applied directly on the skin. 4. Physical therapist will remove dressing. 5. Apply Band-Aids to wound sites and change them once a day. Keep the wound clean and dry. 6. Please do not use bacitracin or other ointments under the bandage. 7. Showering is allowed on post-op day #4 if the wound is dry. MAKE SURE EACH INCISION IS COVERED WITH A WATERPROOF BANDAID DURING SHOWER ONLY! 8. Do not soak the hip in water in a bathtub or pool until the sutures are removed. Typically getting into a bath or pool is permitted 4 weeks after surgery.  9. Driving is permitted after 1 week for L hip surgery only if the narcotic pain medication is no longer being taken and you feel comfortable getting into and out of a car. For R hip surgery, driving is permitted after 2 weeks. Driving a manual car may take up to 3-4 weeks. 10. Please ensure you have a follow-up appointment for suture removal ~2 weeks after surgery.  11. The anesthetic drugs used during your surgery may cause nausea for the first 24 hours. If nausea is encountered, drink only clear liquids (i.e. Sprite or 7-up). The only solids should be dry crackers or toast. If nausea and vomiting become severe or the patient shows sign of dehydration (lack of urination) please call the doctor or the surgicenter. 12. If you develop a fever (101.5), redness, or yellow/brown/green drainage from the surgical incision site, please call our office to  arrange for an evaluation.  13: POST-OPERATIVE PRESCRIPTIONS:  HETERTOPIC BONE PROPHYLAXIS FOR 10 DAYS: 1. EC-Naprosyn 500mg , 1 tablet by mouth two times per day x 30 days 2. Prilosec (Stomach Prophylaxis) 20mg , 1 tablet by mouth daily (take on an empty stomach x 30 days  DVT PROPHYLAXIS 3. Aspirin 325mg  by mouth daily x 2 weeks  PAIN MEDICATION:  4. Oxycodone 1 to 3 tablets by mouth every 4 hours as needed 5. Tylenol 1000mg  three times day for at least 3 days, then as needed to reduce narcotics  ANTI-NAUSEA (if applicable):  6. Zofran 4mg  tablet, 1 tablet every 6 hours as needed. You will be given a prescription, but it is optional to fill it.  ANTI-SPASM (if applicable):  7. Zanaflex 4mg , 2 tablets by mouth every 6 hours as needed.  14. You will take as aspirin (325 mg) daily x 2 weeks. This may lower the risk of a blood clot developing after surgery. Should severe calf pain occur or significant swelling of calf and ankle, please call our offices. 15. Local anesthetics (i.e. Novocaine) are put into the incision after surgery. It is not uncommon for patients to encounter more pain on the first or second day after surgery. This is the time when swelling peaks. Taking pain medication before bedtime will assist in sleeping. It is important not to drink or drive while taking narcotic medication. You should resume your normal medications for other conditions the day after surgery. 16. Follow weight bearing instructions as advised at discharge. Crutches may  be necessary to assist walking. Extremity elevation for the first 72 hours is also encouraged to minimize swelling. 17. If unexpected problems occur and you need to speak to the doctor, call the office.   Important Contact Information Serita Sheller Producer, television/film/video): [phone] Fax Number: [phone]

## 2017-12-24 NOTE — Transfer of Care (Signed)
Immediate Anesthesia Transfer of Care Note  Patient: Alison Owens  Procedure(s) Performed: ARTHROSCOPY HIP,LABRAL REPAIR ACETABULOPLASTY, FEMORAL OSTEOCHONDROPLASTY (Left Hip)  Patient Location: PACU  Anesthesia Type:General  Level of Consciousness: awake, alert  and oriented  Airway & Oxygen Therapy: Patient Spontanous Breathing  Post-op Assessment: Report given to RN and Post -op Vital signs reviewed and stable  Post vital signs: Reviewed  Last Vitals:  Vitals Value Taken Time  BP 118/80 12/24/2017  2:26 PM  Temp    Pulse 98 12/24/2017  2:27 PM  Resp 21 12/24/2017  2:27 PM  SpO2 99 % 12/24/2017  2:27 PM  Vitals shown include unvalidated device data.  Last Pain:  Vitals:   12/24/17 0619  TempSrc: Oral  PainSc: 0-No pain         Complications: No apparent anesthesia complications

## 2017-12-24 NOTE — Anesthesia Procedure Notes (Signed)
Procedure Name: Intubation Performed by: Traeson Dusza, CRNA Pre-anesthesia Checklist: Patient identified, Patient being monitored, Timeout performed, Emergency Drugs available and Suction available Patient Re-evaluated:Patient Re-evaluated prior to induction Oxygen Delivery Method: Circle system utilized Preoxygenation: Pre-oxygenation with 100% oxygen Induction Type: IV induction Ventilation: Mask ventilation without difficulty Laryngoscope Size: Miller and 2 Grade View: Grade I Tube type: Oral Tube size: 7.0 mm Number of attempts: 1 Airway Equipment and Method: Stylet Placement Confirmation: ETT inserted through vocal cords under direct vision,  positive ETCO2 and breath sounds checked- equal and bilateral Secured at: 20 cm Tube secured with: Tape Dental Injury: Teeth and Oropharynx as per pre-operative assessment        

## 2017-12-25 ENCOUNTER — Encounter: Payer: Self-pay | Admitting: Orthopedic Surgery

## 2017-12-25 DIAGNOSIS — S73192A Other sprain of left hip, initial encounter: Secondary | ICD-10-CM | POA: Diagnosis not present

## 2017-12-25 MED ORDER — OXYCODONE HCL 5 MG PO TABS: 5.0000 mg | ORAL_TABLET | ORAL | 0 refills | Status: DC | PRN

## 2017-12-25 MED ORDER — NAPROXEN 500 MG PO TABS: 500.0000 mg | ORAL_TABLET | Freq: Two times a day (BID) | ORAL | 0 refills | Status: DC

## 2017-12-25 MED ORDER — ASPIRIN 325 MG PO TBEC: 325.0000 mg | DELAYED_RELEASE_TABLET | Freq: Every day | ORAL | 0 refills | Status: DC

## 2017-12-25 MED ORDER — ONDANSETRON HCL 4 MG PO TABS: 4.0000 mg | ORAL_TABLET | Freq: Four times a day (QID) | ORAL | 0 refills | Status: DC | PRN

## 2017-12-25 MED ORDER — TRAMADOL HCL 50 MG PO TABS: 50.0000 mg | ORAL_TABLET | Freq: Four times a day (QID) | ORAL | 1 refills | Status: DC | PRN

## 2017-12-25 MED ORDER — TIZANIDINE HCL 4 MG PO TABS: 8.0000 mg | ORAL_TABLET | Freq: Four times a day (QID) | ORAL | 0 refills | Status: DC | PRN

## 2017-12-25 NOTE — Discharge Summary (Signed)
Physician Discharge Summary  Subjective: 1 Day Post-Op Procedure(s) (LRB): ARTHROSCOPY HIP,LABRAL REPAIR ACETABULOPLASTY, FEMORAL OSTEOCHONDROPLASTY (Left) Patient reports pain as mild.   Patient seen in rounds with Dr. Allena Katz. Patient is well, and has had no acute complaints or problems Patient is ready to go home after physical therapy  Physician Discharge Summary  Patient ID: Alison Owens MRN: [id number] DOB/AGE: 1980-02-25 37 y.o.  Admit date: 12/24/2017 Discharge date: 12/25/2017  Admission Diagnoses:  Discharge Diagnoses:  Active Problems:   Femoroacetabular impingement   Discharged Condition: fair  Hospital Course: The patient is postop day 1 from a left hip arthroscopy for femoral acetabular impingement and labral repair.  She is doing well since surgery.  She is to begin physical therapy today.  Her pain is manageable.  Her right hand numbness is improving but still slightly tingly.  Treatments: surgery:  Left hip arthroscopy with acetabuloplasty, labral repair, femoral osteochondroplasty, and capsular closure  SURGEON: Rosealee Albee, MD  ASSISTANT: Dedra Skeens, PA  ANESTHESIA: Gen  ESTIMATED BLOOD LOSS:minimal  TOTAL IV FLUIDS: per anesthesia  Discharge Exam: Blood pressure 92/65, pulse (!) 57, temperature 98.6 F (37 C), temperature source Oral, resp. rate 18, height 5\' 2"  (1.575 m), weight 56.7 kg, SpO2 98 %.   Disposition:    Allergies as of 12/25/2017   No Known Allergies     Medication List    TAKE these medications   aspirin 325 MG EC tablet Take 1 tablet (325 mg total) by mouth daily.   cetirizine 10 MG tablet Commonly known as:  ZYRTEC Take 10 mg by mouth daily.   MILI 0.25-35 MG-MCG tablet Generic drug:  norgestimate-ethinyl estradiol Take 1 tablet by mouth daily.   multivitamin with minerals tablet Take 1 tablet by mouth daily.   naproxen 500 MG tablet Commonly known as:  NAPROSYN Take 1 tablet (500 mg total) by mouth 2  (two) times daily with a meal.   ondansetron 4 MG tablet Commonly known as:  ZOFRAN Take 1 tablet (4 mg total) by mouth every 6 (six) hours as needed for nausea.   OVER THE COUNTER MEDICATION Take 1 tablet by mouth daily. Migravent - Migraine Prevention   oxyCODONE 5 MG immediate release tablet Commonly known as:  Oxy IR/ROXICODONE Take 1-2 tablets (5-10 mg total) by mouth every 4 (four) hours as needed for moderate pain or severe pain.   sertraline 25 MG tablet Commonly known as:  ZOLOFT Take 25 mg by mouth at bedtime.   tiZANidine 4 MG tablet Commonly known as:  ZANAFLEX Take 2 tablets (8 mg total) by mouth every 6 (six) hours as needed for muscle spasms.   traMADol 50 MG tablet Commonly known as:  ULTRAM Take 1-2 tablets (50-100 mg total) by mouth every 6 (six) hours as needed for moderate pain.      Follow-up Information    Signa Kell, MD. Go in 2 week(s).   Specialty:  Orthopedic Surgery Contact information: [address] [place] [address] [phone]           Signed: Lenard Forth, [place] Deleo 12/25/2017, 6:20 AM   Objective: Vital signs in last 24 hours: Temp:  [97.5 F (36.4 C)-98.6 F (37 C)] 98.6 F (37 C) (11/04 2338) Pulse Rate:  [57-102] 57 (11/04 2338) Resp:  [7-21] 18 (11/04 2338) BP: (92-121)/(65-82) 92/65 (11/04 2338) SpO2:  [98 %-100 %] 98 % (11/04 2338)  Intake/Output from previous day:  Intake/Output Summary (Last 24 hours) at 12/25/2017 2130 Last data filed at 12/25/2017  0430 Gross per 24 hour  Intake 2422.85 ml  Output 1250 ml  Net 1172.85 ml    Intake/Output this shift: Total I/O In: 811 [I.V.:811] Out: -   Labs: No results for input(s): HGB in the last 72 hours. No results for input(s): WBC, RBC, HCT, PLT in the last 72 hours. No results for input(s): NA, K, CL, CO2, BUN, CREATININE, GLUCOSE, CALCIUM in the last 72 hours. No results for input(s): LABPT, INR in the last 72 hours.  EXAM: General - Patient is Alert and  Oriented Extremity -left lower extremity:  Neurovascular intact Sensation intact distally Compartment soft  Right upper extremity: Good skin warmth with normal grip.  Sensation improving with positive Tinel's test at median nerve. Incision - clean, dry, no drainage Motor Function -plantarflexion and dorsiflexion intact.  Assessment/Plan: 1 Day Post-Op Procedure(s) (LRB): ARTHROSCOPY HIP,LABRAL REPAIR ACETABULOPLASTY, FEMORAL OSTEOCHONDROPLASTY (Left) Procedure(s) (LRB): ARTHROSCOPY HIP,LABRAL REPAIR ACETABULOPLASTY, FEMORAL OSTEOCHONDROPLASTY (Left) Past Medical History:  Diagnosis Date  . Anxiety   . Headache   . PONV (postoperative nausea and vomiting)    Active Problems:   Femoroacetabular impingement  Estimated body mass index is 22.86 kg/m as calculated from the following:   Height as of this encounter: 5\' 2"  (1.575 m).   Weight as of this encounter: 56.7 kg. Advance diet Up with therapy D/C IV fluids Diet - Regular diet Follow up - in 2 weeks Activity -flatfoot weight-bear on left lower extremity with hip abduction brace intact. Disposition - Home Condition Upon Discharge - Stable DVT Prophylaxis - Aspirin  Dedra Skeens, PA-C Orthopaedic Surgery 12/25/2017, 6:20 AM

## 2017-12-25 NOTE — Evaluation (Signed)
Physical Therapy Evaluation Patient Details Name: Alison Owens MRN: [id number] DOB: 12-06-1980 Today's Date: 12/25/2017   History of Present Illness  37 y/o with L femoroacetabular impingement and is s/p hip arthroscopy with acetabuloplasty, labral repair, femoral osteochondroplasty, and capsular closure 11/4.  Clinical Impression  Pt did well with PT though she was initially somewhat hesitant and anxious to do much with L LE.  She showed good understanding with WBing limitations and adherence, was able to participate with ~10 minutes of light supine exercises as well as ~15 minutes of gait/stair training with crutches (which she has not had previous experience).  Overall pt is safe to return home, showed good ability to manage steps and will have good support at home.  Expected d/c this date with f/u outpatient PT set up    Follow Up Recommendations Follow surgeon's recommendation for DC plan and follow-up therapies;Outpatient PT    Equipment Recommendations  Crutches (states she can borrow a BSC from friend if needed)   Recommendations for Other Services       Precautions / Restrictions Precautions Precautions: Fall Required Braces or Orthoses: (hip ABd brace) Restrictions LLE Weight Bearing: Touchdown weight bearing      Mobility  Bed Mobility Overal bed mobility: Independent             General bed mobility comments: Pt was able to rise to sitting EOB w/o direct assist, somewhat hesitant with L LE  Transfers Overall transfer level: Modified independent Equipment used: Crutches             General transfer comment: Pt able to do ~5 sit to stands t/o session with good control and confidence, able to maintain TTWBing on L  Ambulation/Gait Ambulation/Gait assistance: Supervision Gait Distance (Feet): 250 Feet Assistive device: Crutches       General Gait Details: Pt with some initial hesitancy, but quickly was able to gain confidence using crutches and easily  maintained TT to non-WBing t/o the effort  Stairs Stairs: Yes Stairs assistance: Modified independent (Device/Increase time) Stair Management: Backwards;With crutches;No rails;One rail Right Number of Stairs: 8 General stair comments: Pt was able to negotiate up/down steps with b/l crutches and with single rail and single crutch use with good safety and after some initial cuing good confidence  Wheelchair Mobility    Modified Rankin (Stroke Patients Only)       Balance Overall balance assessment: Independent                                           Pertinent Vitals/Pain Pain Assessment: 0-10 Pain Score: 6  Pain Location: L hip, pain does increase with activity    Home Living Family/patient expects to be discharged to:: Private residence Living Arrangements: Spouse/significant other Available Help at Discharge: Available 24 hours/day(husband works from home, mother will be staying this week) Type of Home: House Home Access: Stairs to enter Entrance Stairs-Rails: None Secretary/administrator of Steps: 2 Home Layout: Two level Home Equipment: None      Prior Function Level of Independence: Independent         Comments: 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      Hand Dominance        Extremity/Trunk Assessment   Upper Extremity Assessment Upper Extremity Assessment: Overall WFL for tasks assessed    Lower Extremity Assessment Lower  Extremity Assessment: Overall WFL for tasks assessed(except L hip with expected post-op weakness)       Communication   Communication: No difficulties  Cognition Arousal/Alertness: Awake/alert Behavior During Therapy: Anxious;WFL for tasks assessed/performed Overall Cognitive Status: Within Functional Limits for tasks assessed                                        General Comments      Exercises General Exercises - Lower Extremity Ankle  Circles/Pumps: AROM;10 reps Quad Sets: AROM;10 reps Short Arc Quad: Strengthening;10 reps Heel Slides: AROM;10 reps Hip ABduction/ADduction: AROM;10 reps(very limited ROM on L) Straight Leg Raises: (unable to perform on L) Hip Flexion/Marching: AROM;10 reps   Assessment/Plan    PT Assessment Patient needs continued PT services  PT Problem List Decreased strength;Decreased range of motion;Decreased activity tolerance;Decreased balance;Decreased mobility;Decreased coordination;Decreased knowledge of use of DME;Decreased safety awareness;Pain       PT Treatment Interventions DME instruction;Gait training;Stair training;Functional mobility training;Therapeutic activities;Therapeutic exercise;Neuromuscular re-education;Balance training;Patient/family education    PT Goals (Current goals can be found in the Care Plan section)  Acute Rehab PT Goals Patient Stated Goal: go home, get back to running PT Goal Formulation: With patient Time For Goal Achievement: 01/08/18 Potential to Achieve Goals: Good    Frequency 7X/week   Barriers to discharge        Co-evaluation               AM-PAC PT "6 Clicks" Daily Activity  Outcome Measure Difficulty turning over in bed (including adjusting bedclothes, sheets and blankets)?: None Difficulty moving from lying on back to sitting on the side of the bed? : A Little Difficulty sitting down on and standing up from a chair with arms (e.g., wheelchair, bedside commode, etc,.)?: None Help needed moving to and from a bed to chair (including a wheelchair)?: None Help needed walking in hospital room?: None Help needed climbing 3-5 steps with a railing? : A Little 6 Click Score: 22    End of Session Equipment Utilized During Treatment: Gait belt Activity Tolerance: Patient tolerated treatment well Patient left: with chair alarm set;with call bell/phone within reach Nurse Communication: Mobility status PT Visit Diagnosis: Difficulty in walking,  not elsewhere classified (R26.2);Muscle weakness (generalized) (M62.81);Pain Pain - Right/Left: Left Pain - part of body: Hip    Time: 4098-1191 PT Time Calculation (min) (ACUTE ONLY): 35 min   Charges:   PT Evaluation $PT Eval Low Complexity: 1 Low PT Treatments $Gait Training: 8-22 mins $Therapeutic Exercise: 8-22 mins        Malachi Pro, DPT 12/25/2017, 10:35 AM

## 2017-12-25 NOTE — Progress Notes (Signed)
New order for patches sent to [facility] in [place], [place]

## 2017-12-25 NOTE — Progress Notes (Signed)
Pt requested patch for nausea. Paged MD

## 2017-12-25 NOTE — Progress Notes (Addendum)
  Subjective: 1 Day Post-Op Procedure(s) (LRB): ARTHROSCOPY HIP,LABRAL REPAIR ACETABULOPLASTY, FEMORAL OSTEOCHONDROPLASTY (Left) Patient reports pain as mild.   Patient seen in rounds with Dr. Allena Katz. Patient is well, and has had no acute complaints or problems Plan is to go Home after hospital stay. Negative for chest pain and shortness of breath Fever: no Gastrointestinal: Negative for nausea and vomiting  Objective: Vital signs in last 24 hours: Temp:  [97.5 F (36.4 C)-98.6 F (37 C)] 98.6 F (37 C) (11/04 2338) Pulse Rate:  [57-102] 57 (11/04 2338) Resp:  [7-21] 18 (11/04 2338) BP: (92-121)/(65-82) 92/65 (11/04 2338) SpO2:  [98 %-100 %] 98 % (11/04 2338) Weight:  [56.7 kg] 56.7 kg (11/04 0619)  Intake/Output from previous day:  Intake/Output Summary (Last 24 hours) at 12/25/2017 0613 Last data filed at 12/25/2017 0430 Gross per 24 hour  Intake 2422.85 ml  Output 1250 ml  Net 1172.85 ml    Intake/Output this shift: Total I/O In: 811 [I.V.:811] Out: -   Labs: No results for input(s): HGB in the last 72 hours. No results for input(s): WBC, RBC, HCT, PLT in the last 72 hours. No results for input(s): NA, K, CL, CO2, BUN, CREATININE, GLUCOSE, CALCIUM in the last 72 hours. No results for input(s): LABPT, INR in the last 72 hours.   EXAM General - Patient is Alert and Oriented Extremity -left lower extremity:  Neurovascular intact Sensation intact distally Dorsiflexion/Plantar flexion intact No cellulitis present Compartment soft  Right upper extremity: Increased sensation to light touch.  Positive Tinel's test at the median nerve.  No increased swelling.  Good skin color and warmth. Dressing/Incision - clean, dry, no drainage Motor Function - intact, moving foot and toes well on exam.  Hip abduction brace in place.  Past Medical History:  Diagnosis Date  . Anxiety   . Headache   . PONV (postoperative nausea and vomiting)     Assessment/Plan: 1 Day Post-Op  Procedure(s) (LRB): ARTHROSCOPY HIP,LABRAL REPAIR ACETABULOPLASTY, FEMORAL OSTEOCHONDROPLASTY (Left) Active Problems:   Femoroacetabular impingement  Estimated body mass index is 22.86 kg/m as calculated from the following:   Height as of this encounter: 5\' 2"  (1.575 m).   Weight as of this encounter: 56.7 kg. Advance diet Up with therapy D/C IV fluids  Plan to discharge home after physical therapy. Flatfoot weight-bear on operative extremity with the abduction brace. Finish antibiotics.  DVT Prophylaxis - Aspirin Weight-Bearing as tolerated to left leg  Dedra Skeens, PA-C Orthopaedic Surgery 12/25/2017, 6:13 AM

## 2017-12-26 LAB — HIV ANTIBODY (ROUTINE TESTING W REFLEX): HIV Screen 4th Generation wRfx: NONREACTIVE

## 2017-12-26 NOTE — Anesthesia Postprocedure Evaluation (Signed)
Anesthesia Post Note  Patient: Terris Bodin  Procedure(s) Performed: ARTHROSCOPY HIP,LABRAL REPAIR ACETABULOPLASTY, FEMORAL OSTEOCHONDROPLASTY (Left Hip)  Patient location during evaluation: PACU Anesthesia Type: General Level of consciousness: awake and alert Pain management: pain level controlled Vital Signs Assessment: post-procedure vital signs reviewed and stable Respiratory status: spontaneous breathing, nonlabored ventilation, respiratory function stable and patient connected to nasal cannula oxygen Cardiovascular status: blood pressure returned to baseline and stable Postop Assessment: no apparent nausea or vomiting Anesthetic complications: no     Last Vitals:  Vitals:   12/24/17 2338 12/25/17 0831  BP: 92/65 117/81  Pulse: (!) 57 63  Resp: 18 18  Temp: 37 C 37 C  SpO2: 98% 100%    Last Pain:  Vitals:   12/25/17 0831  TempSrc: Oral  PainSc:                  Yevette Edwards

## 2018-02-01 ENCOUNTER — Encounter: Payer: Self-pay | Admitting: *Deleted

## 2018-02-01 ENCOUNTER — Other Ambulatory Visit: Payer: Self-pay

## 2018-02-01 ENCOUNTER — Encounter
Admission: RE | Admit: 2018-02-01 | Discharge: 2018-02-01 | Disposition: A | Discharge status: Home / Self Care | Payer: BC Managed Care – PPO | Source: Ambulatory Visit | Attending: Orthopedic Surgery | Admitting: Orthopedic Surgery

## 2018-02-01 NOTE — Patient Instructions (Signed)
Your procedure is scheduled on: 02-07-18 Report to Same Day Surgery 2nd floor medical mall [facility](Medical Mall Entrance-take elevator on left to 2nd floor.  Check in with surgery information desk.) To find out your arrival time please call [phone] between 1PM - 3PM on 02-06-18  Remember: Instructions that are not followed completely may result in serious medical risk, up to and including death, or upon the discretion of your surgeon and anesthesiologist your surgery may need to be rescheduled.    _x___ 1. Do not eat food after midnight the night before your procedure. You may drink clear liquids up to 2 hours before you are scheduled to arrive at the hospital for your procedure.  Do not drink clear liquids within 2 hours of your scheduled arrival to the hospital.  Clear liquids include  --Water or Apple juice without pulp  --Clear carbohydrate beverage such as ClearFast or Gatorade  --Black Coffee or Clear Tea (No milk, no creamers, do not add anything to the coffee or Tea   ____Ensure clear carbohydrate drink on the way to the hospital for bariatric patients  ____Ensure clear carbohydrate drink 3 hours before surgery for Dr Rutherford NailByrnett's patients if physician instructed.   No gum chewing or hard candies.     __x__ 2. No Alcohol for 24 hours before or after surgery.   __x__3. No Smoking or e-cigarettes for 24 prior to surgery.  Do not use any chewable tobacco products for at least 6 hour prior to surgery   ____  4. Bring all medications with you on the day of surgery if instructed.    __x__ 5. Notify your doctor if there is any change in your medical condition     (cold, fever, infections).    x___6. On the morning of surgery brush your teeth with toothpaste and water.  You may rinse your mouth with mouth wash if you wish.  Do not swallow any toothpaste or mouthwash.   Do not wear jewelry, make-up, hairpins, clips or nail polish.  Do not wear lotions, powders, or perfumes. You may wear  deodorant.  Do not shave 48 hours prior to surgery. Men may shave face and neck.  Do not bring valuables to the hospital.    [facility] not responsible for any belongings or valuables.               Contacts, dentures or bridgework may not be worn into surgery.  Leave your suitcase in the car. After surgery it may be brought to your room.  For patients admitted to the hospital, discharge time is determined by your treatment team.  _  Patients discharged the day of surgery will not be allowed to drive home.  You will need someone to drive you home and stay with you the night of your procedure.    Please read over the following fact sheets that you were given:   [facility] Pre[facility]   _x___ Take the following medication the morning of surgery with a small sip of water. These include:  1. ZYRTEC  2.  3.  4.  5.  6.  ____Fleets enema or Magnesium Citrate as directed.   ____ Use CHG Soap or sage wipes as directed on instruction sheet   ____ Use inhalers on the day of surgery and bring to hospital day of surgery  ____ Stop Metformin and Janumet 2 days prior to surgery.    ____ Take 1/2 of usual insulin dose the night before surgery  and none on the morning surgery.   ____ Follow recommendations from Cardiologist, Pulmonologist or PCP regarding stopping Aspirin, Coumadin, Plavix ,Eliquis, Effient, or Pradaxa, and Pletal.  X____Stop Anti-inflammatories such as Advil, Aleve, Ibuprofen, Motrin, Naproxen, Naprosyn, Goodies powders or aspirin products NOW-OK to take Tylenol    ____ Stop supplements until after surgery.     ____ Bring C-Pap to the hospital.

## 2018-02-07 ENCOUNTER — Ambulatory Visit
Admission: RE | Admit: 2018-02-07 | Payer: BC Managed Care – PPO | Source: Home / Self Care | Admitting: Orthopedic Surgery

## 2018-02-07 ENCOUNTER — Encounter: Admission: RE | Payer: Self-pay | Source: Home / Self Care

## 2018-02-07 SURGERY — CARPAL TUNNEL RELEASE
Anesthesia: Choice | Laterality: Right

## 2018-12-05 ENCOUNTER — Other Ambulatory Visit: Admission: RE | Admit: 2018-12-05 | Payer: BC Managed Care – PPO | Source: Ambulatory Visit

## 2018-12-09 ENCOUNTER — Other Ambulatory Visit: Admission: RE | Admit: 2018-12-09 | Payer: BC Managed Care – PPO | Source: Ambulatory Visit

## 2018-12-12 ENCOUNTER — Ambulatory Visit: Admit: 2018-12-12 | Payer: BC Managed Care – PPO | Admitting: Orthopedic Surgery

## 2018-12-12 SURGERY — CARPAL TUNNEL RELEASE
Anesthesia: Choice | Laterality: Right

## 2020-05-25 IMAGING — RF DG HIP (WITH PELVIS) OPERATIVE*L*
1 series · 3 of 3 positions shown · non-contrast
Comparison: 11/16/2017 CT

CLINICAL DATA: Hip arthroscopy, labral repair and femoral
osteochondral plasty.

EXAM:
OPERATIVE LEFT HIP (WITH PELVIS IF PERFORMED)  VIEWS
TECHNIQUE: Fluoroscopic spot image(s) were submitted for interpretation
post-operatively.

[Series 1: run · 3 of 3 slices shown]
[im 1/3]
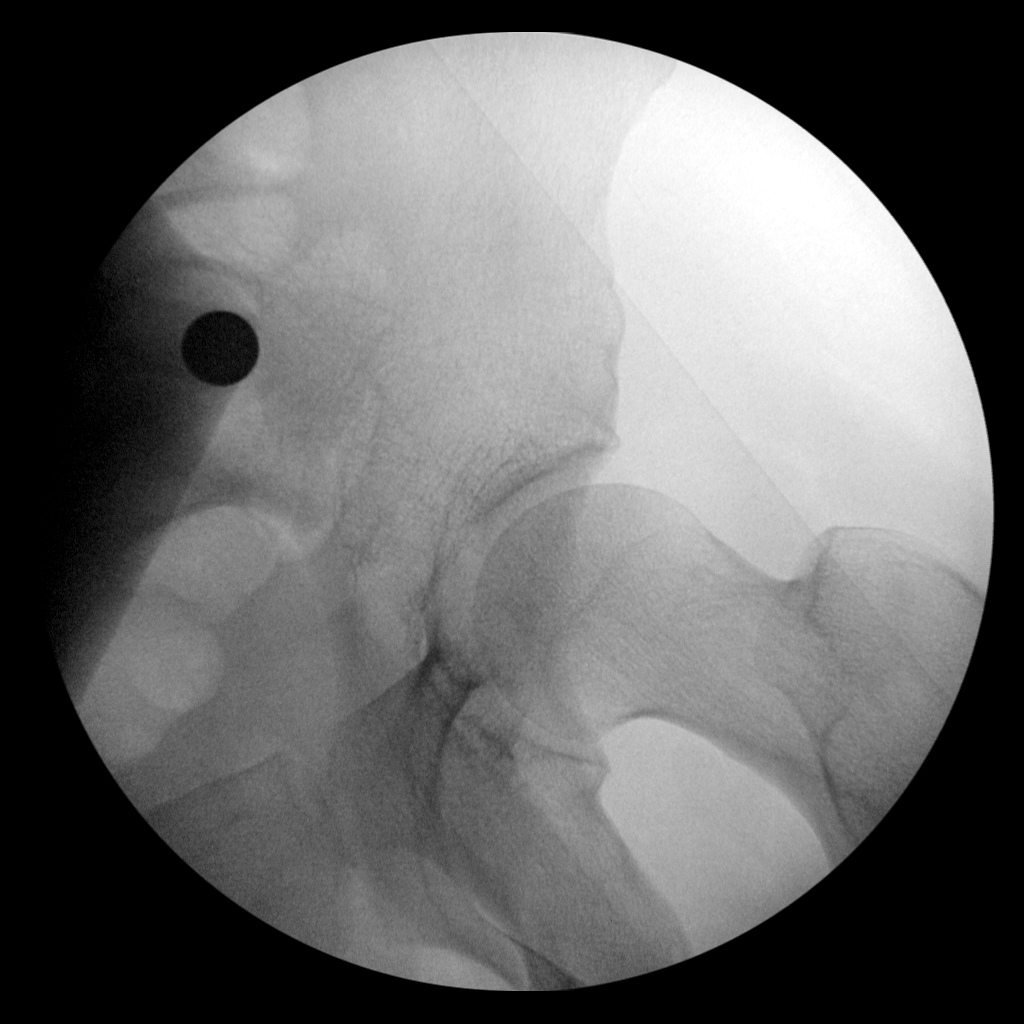
[im 2/3]
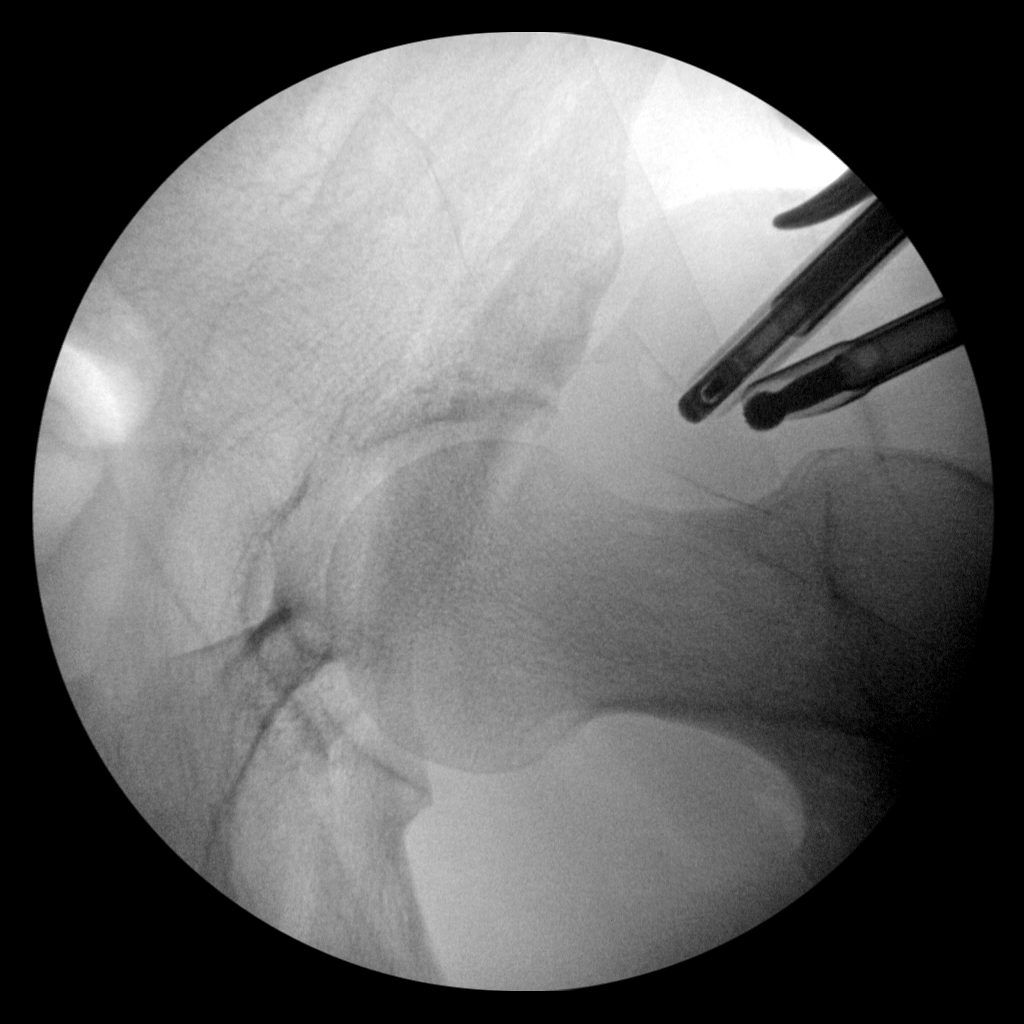
[im 3/3]
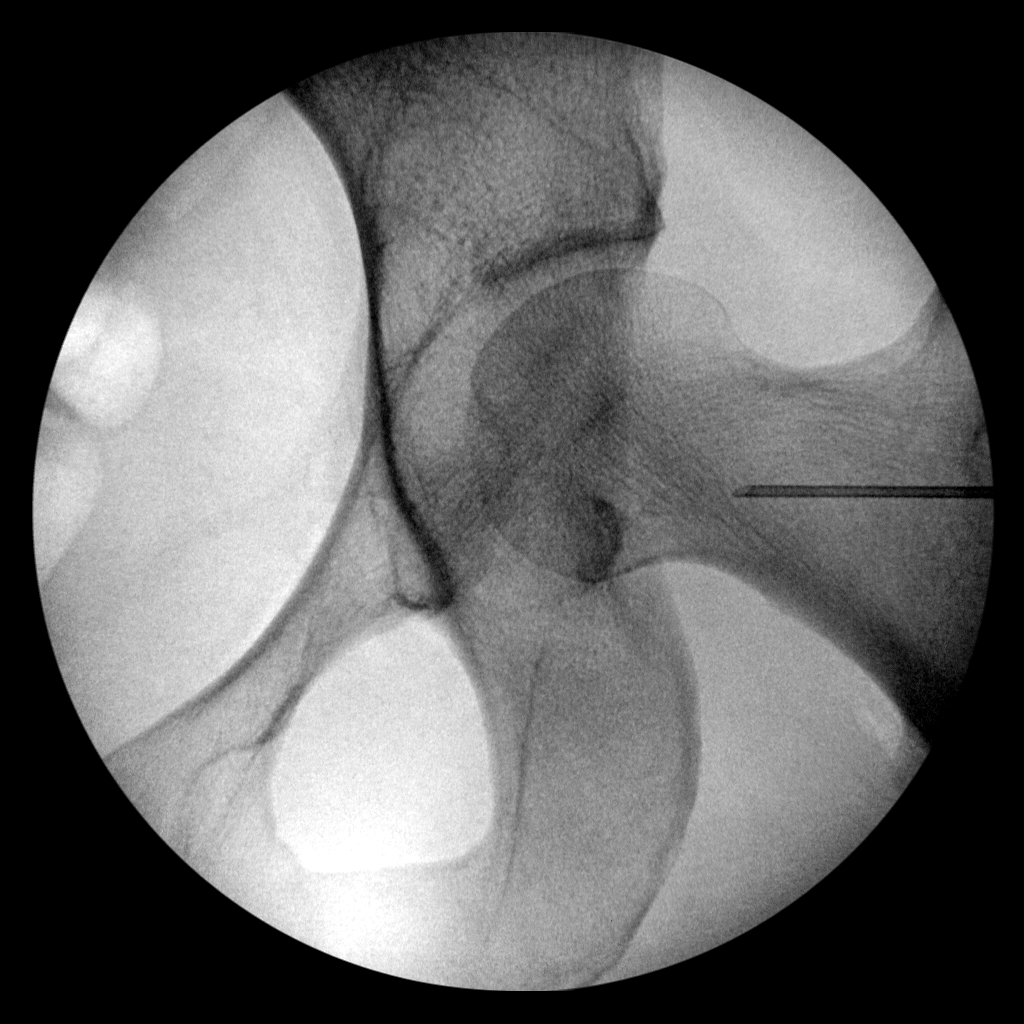

[3 of 3 positions shown; findings below may reference images not displayed]

FINDINGS: Intraoperative views of the LEFT hip are submitted demonstrating
surgical hardware. On the last image, a needle/probe tip overlies
the femoral neck.
IMPRESSION: As above

## 2021-08-25 ENCOUNTER — Ambulatory Visit
Admission: RE | Admit: 2021-08-25 | Discharge: 2021-08-25 | Disposition: A | Discharge status: Home / Self Care | Payer: BC Managed Care – PPO | Source: Ambulatory Visit | Attending: Orthopedic Surgery | Admitting: Orthopedic Surgery

## 2021-08-25 ENCOUNTER — Other Ambulatory Visit: Payer: Self-pay | Admitting: Orthopedic Surgery

## 2021-08-25 DIAGNOSIS — M76891 Other specified enthesopathies of right lower limb, excluding foot: Secondary | ICD-10-CM

## 2021-08-25 DIAGNOSIS — R609 Edema, unspecified: Secondary | ICD-10-CM | POA: Diagnosis not present

## 2021-08-25 DIAGNOSIS — M25551 Pain in right hip: Secondary | ICD-10-CM | POA: Diagnosis present

## 2021-08-25 MED ORDER — IOHEXOL 180 MG/ML  SOLN
20.0000 mL | Freq: Once | INTRAMUSCULAR | Status: AC | PRN
  Administered 2021-08-25: 15 mL

## 2021-08-25 MED ORDER — GADOBUTROL 1 MMOL/ML IV SOLN
1.0000 mL | Freq: Once | INTRAVENOUS | Status: AC | PRN
  Administered 2021-08-25: 0.05 mL

## 2021-08-25 MED ORDER — LIDOCAINE HCL (PF) 1 % IJ SOLN
10.0000 mL | Freq: Once | INTRAMUSCULAR | Status: AC
  Administered 2021-08-25: 5 mL
  Filled 2021-08-25: qty 10

## 2021-08-25 MED ORDER — SODIUM CHLORIDE (PF) 0.9 % IJ SOLN
20.0000 mL | Freq: Once | INTRAMUSCULAR | Status: AC
  Administered 2021-08-25: 5 mL via INTRAVENOUS

## 2021-10-07 ENCOUNTER — Other Ambulatory Visit: Payer: Self-pay | Admitting: Orthopedic Surgery

## 2021-10-07 ENCOUNTER — Other Ambulatory Visit (HOSPITAL_COMMUNITY): Payer: Self-pay | Admitting: Orthopedic Surgery

## 2021-10-07 DIAGNOSIS — M25551 Pain in right hip: Secondary | ICD-10-CM

## 2021-10-11 ENCOUNTER — Ambulatory Visit
Admission: RE | Admit: 2021-10-11 | Discharge: 2021-10-11 | Disposition: A | Discharge status: Home / Self Care | Payer: BC Managed Care – PPO | Source: Ambulatory Visit | Attending: Orthopedic Surgery | Admitting: Orthopedic Surgery

## 2021-10-11 DIAGNOSIS — M25551 Pain in right hip: Secondary | ICD-10-CM | POA: Insufficient documentation

## 2021-10-25 ENCOUNTER — Other Ambulatory Visit: Payer: Self-pay | Admitting: Orthopedic Surgery

## 2021-11-21 ENCOUNTER — Encounter
Admission: RE | Admit: 2021-11-21 | Discharge: 2021-11-21 | Disposition: A | Discharge status: Home / Self Care | Payer: BC Managed Care – PPO | Source: Ambulatory Visit | Attending: Orthopedic Surgery | Admitting: Orthopedic Surgery

## 2021-11-21 VITALS — Ht 62.0 in | Wt 130.0 lb

## 2021-11-21 DIAGNOSIS — M25859 Other specified joint disorders, unspecified hip: Secondary | ICD-10-CM

## 2021-11-21 DIAGNOSIS — Z01812 Encounter for preprocedural laboratory examination: Secondary | ICD-10-CM

## 2021-11-21 HISTORY — DX: Bronchitis, not specified as acute or chronic: J40

## 2021-11-21 HISTORY — DX: Other specified joint disorders, unspecified hip: M25.859

## 2021-11-21 NOTE — Patient Instructions (Addendum)
Your procedure is scheduled on: Tuesday, October 10 Report to the Registration Desk on the 1st floor of the [facility]. To find out your arrival time, please call [phone] between 1PM - 3PM on: Monday, October 9 If your arrival time is 6:00 am, do not arrive prior to that time as the [facility] entrance doors do not open until 6:00 am.  REMEMBER: Instructions that are not followed completely may result in serious medical risk, up to and including death; or upon the discretion of your surgeon and anesthesiologist your surgery may need to be rescheduled.  Do not eat food after midnight the night before surgery.  No gum chewing, lozengers or hard candies.  You may however, drink CLEAR liquids up to 2 hours before you are scheduled to arrive for your surgery. Do not drink anything within 2 hours of your scheduled arrival time.  Clear liquids include: - water  - apple juice without pulp - gatorade (not RED colors) - black coffee or tea (Do NOT add milk or creamers to the coffee or tea) Do NOT drink anything that is not on this list.  In addition, your doctor has ordered for you to drink the provided  Ensure Pre-Surgery Clear Carbohydrate Drink  Drinking this carbohydrate drink up to two hours before surgery helps to reduce insulin resistance and improve patient outcomes. Please complete drinking 2 hours prior to scheduled arrival time.  TAKE THESE MEDICATIONS THE MORNING OF SURGERY WITH A SIP OF WATER:  Sertraline (zoloft)  Use your albuterol inhaler on the day of surgery and bring to the hospital.  One week prior to surgery: starting October 3 Stop Anti-inflammatories (NSAIDS) such as Advil, Aleve, Ibuprofen, Motrin, Naproxen, Naprosyn and Aspirin based products such as Excedrin, Goodys Powder, BC Powder. Stop ANY OVER THE COUNTER supplements until after surgery. Stop multiple vitamin. You may however, continue to take Tylenol if needed for pain up until the day of  surgery.  No Alcohol for 24 hours before or after surgery.  No Smoking including e-cigarettes for 24 hours prior to surgery.  No chewable tobacco products for at least 6 hours prior to surgery.  No nicotine patches on the day of surgery.  Do not use any "recreational" drugs for at least a week prior to your surgery.  Please be advised that the combination of cocaine and anesthesia may have negative outcomes, up to and including death. If you test positive for cocaine, your surgery will be cancelled.  On the morning of surgery brush your teeth with toothpaste and water, you may rinse your mouth with mouthwash if you wish. Do not swallow any toothpaste or mouthwash.  Use CHG Soap as directed on instruction sheet.  Do not wear jewelry, make-up, hairpins, clips or nail polish.  Do not wear lotions, powders, or perfumes.   Do not shave body from the neck down 48 hours prior to surgery just in case you cut yourself which could leave a site for infection.  Also, freshly shaved skin may become irritated if using the CHG soap.  Contact lenses, hearing aids and dentures may not be worn into surgery.  Do not bring valuables to the hospital. [facility] is not responsible for any missing/lost belongings or valuables.   Notify your doctor if there is any change in your medical condition (cold, fever, infection).  Wear comfortable clothing (specific to your surgery type) to the hospital.  After surgery, you can help prevent lung complications by doing breathing exercises.  Take  deep breaths and cough every 1-2 hours. Your doctor may order a device called an Incentive Spirometer to help you take deep breaths.  If you are being admitted to the hospital overnight, leave your suitcase in the car. After surgery it may be brought to your room.  If you are being discharged the day of surgery, you will not be allowed to drive home. You will need a responsible adult (18 years or older) to drive you  home and stay with you that night.   If you are taking public transportation, you will need to have a responsible adult (18 years or older) with you. Please confirm with your physician that it is acceptable to use public transportation.   Please call the Pre-admissions Testing Dept. at [phone] if you have any questions about these instructions.  Surgery Visitation Policy:  Patients undergoing a surgery or procedure may have two family members or support persons with them as long as the person is not COVID-19 positive or experiencing its symptoms.   Inpatient Visitation:    Visiting hours are 7 a.m. to 8 p.m. Up to four visitors are allowed at one time in a patient room, including children. The visitors may rotate out with other people during the day. One designated support person (adult) may remain overnight.      Preparing for Surgery with CHLORHEXIDINE GLUCONATE (CHG) Soap  Chlorhexidine Gluconate (CHG) Soap  o An antiseptic cleaner that kills germs and bonds with the skin to continue killing germs even after washing  o Used for showering the night before surgery and morning of surgery  Before surgery, you can play an important role by reducing the number of germs on your skin.  CHG (Chlorhexidine gluconate) soap is an antiseptic cleanser which kills germs and bonds with the skin to continue killing germs even after washing.  Please do not use if you have an allergy to CHG or antibacterial soaps. If your skin becomes reddened/irritated stop using the CHG.  1. Shower the NIGHT BEFORE SURGERY and the MORNING OF SURGERY with CHG soap.  2. If you choose to wash your hair, wash your hair first as usual with your normal shampoo.  3. After shampooing, rinse your hair and body thoroughly to remove the shampoo.  4. Use CHG as you would any other liquid soap. You can apply CHG directly to the skin and wash gently with a scrungie or a clean washcloth.  5. Apply the CHG soap to  your body only from the neck down. Do not use on open wounds or open sores. Avoid contact with your eyes, ears, mouth, and genitals (private parts). Wash face and genitals (private parts) with your normal soap.  6. Wash thoroughly, paying special attention to the area where your surgery will be performed.  7. Thoroughly rinse your body with warm water.  8. Do not shower/wash with your normal soap after using and rinsing off the CHG soap.  9. Pat yourself dry with a clean towel.  10. Wear clean pajamas to bed the night before surgery.  12. Place clean sheets on your bed the night of your first shower and do not sleep with pets.  13. Shower again with the CHG soap on the day of surgery prior to arriving at the hospital.  14. Do not apply any deodorants/lotions/powders.  15. Please wear clean clothes to the hospital.

## 2021-11-24 ENCOUNTER — Encounter: Payer: Self-pay | Admitting: Urgent Care

## 2021-11-24 ENCOUNTER — Encounter
Admission: RE | Admit: 2021-11-24 | Discharge: 2021-11-24 | Disposition: A | Discharge status: Home / Self Care | Payer: BC Managed Care – PPO | Source: Ambulatory Visit | Attending: Orthopedic Surgery | Admitting: Orthopedic Surgery

## 2021-11-24 DIAGNOSIS — Z01812 Encounter for preprocedural laboratory examination: Secondary | ICD-10-CM | POA: Diagnosis present

## 2021-11-24 DIAGNOSIS — M25859 Other specified joint disorders, unspecified hip: Secondary | ICD-10-CM | POA: Diagnosis not present

## 2021-11-24 LAB — CBC
HCT: 43.1 % (ref 36.0–46.0)
Hemoglobin: 14.8 g/dL (ref 12.0–15.0)
MCH: 30.8 pg (ref 26.0–34.0)
MCHC: 34.3 g/dL (ref 30.0–36.0)
MCV: 89.8 fL (ref 80.0–100.0)
Platelets: 325 10*3/uL (ref 150–400)
RBC: 4.8 MIL/uL (ref 3.87–5.11)
RDW: 12 % (ref 11.5–15.5)
WBC: 6.7 10*3/uL (ref 4.0–10.5)
nRBC: 0 % (ref 0.0–0.2)

## 2021-11-24 LAB — BASIC METABOLIC PANEL
Anion gap: 9 (ref 5–15)
BUN: 14 mg/dL (ref 6–20)
CO2: 26 mmol/L (ref 22–32)
Calcium: 9.5 mg/dL (ref 8.9–10.3)
Chloride: 102 mmol/L (ref 98–111)
Creatinine, Ser: 0.8 mg/dL (ref 0.44–1.00)
GFR, Estimated: >60 mL/min (ref >60)
Glucose, Bld: 92 mg/dL (ref 70–99)
Potassium: 4.1 mmol/L (ref 3.5–5.1)
Sodium: 137 mmol/L (ref 135–145)

## 2021-11-29 ENCOUNTER — Ambulatory Visit: Payer: BC Managed Care – PPO | Admitting: General Practice

## 2021-11-29 ENCOUNTER — Encounter
Admission: RE | Disposition: A | Discharge status: Home / Self Care | Payer: Self-pay | Source: Home / Self Care | Attending: Orthopedic Surgery

## 2021-11-29 ENCOUNTER — Ambulatory Visit: Payer: BC Managed Care – PPO

## 2021-11-29 ENCOUNTER — Other Ambulatory Visit: Payer: Self-pay

## 2021-11-29 ENCOUNTER — Observation Stay
Admission: RE | Admit: 2021-11-29 | Discharge: 2021-11-30 | Disposition: A | Discharge status: Home / Self Care | Payer: BC Managed Care – PPO | Attending: Orthopedic Surgery | Admitting: Orthopedic Surgery

## 2021-11-29 ENCOUNTER — Encounter: Payer: Self-pay | Admitting: Orthopedic Surgery

## 2021-11-29 DIAGNOSIS — M76891 Other specified enthesopathies of right lower limb, excluding foot: Secondary | ICD-10-CM | POA: Diagnosis not present

## 2021-11-29 DIAGNOSIS — S73191A Other sprain of right hip, initial encounter: Principal | ICD-10-CM | POA: Insufficient documentation

## 2021-11-29 DIAGNOSIS — Z23 Encounter for immunization: Secondary | ICD-10-CM | POA: Diagnosis not present

## 2021-11-29 DIAGNOSIS — Z01812 Encounter for preprocedural laboratory examination: Secondary | ICD-10-CM

## 2021-11-29 DIAGNOSIS — X58XXXA Exposure to other specified factors, initial encounter: Secondary | ICD-10-CM | POA: Diagnosis not present

## 2021-11-29 DIAGNOSIS — M25851 Other specified joint disorders, right hip: Secondary | ICD-10-CM | POA: Diagnosis not present

## 2021-11-29 DIAGNOSIS — M25859 Other specified joint disorders, unspecified hip: Secondary | ICD-10-CM | POA: Diagnosis present

## 2021-11-29 HISTORY — PX: HIP ARTHROSCOPY: SHX668

## 2021-11-29 LAB — POCT PREGNANCY, URINE: Preg Test, Ur: NEGATIVE

## 2021-11-29 SURGERY — ARTHROSCOPY HIP
Anesthesia: General | Site: Hip | Laterality: Right

## 2021-11-29 MED ORDER — SUGAMMADEX SODIUM 200 MG/2ML IV SOLN
INTRAVENOUS | Status: DC | PRN
  Administered 2021-11-29: 150 mg via INTRAVENOUS

## 2021-11-29 MED ORDER — ADULT MULTIVITAMIN W/MINERALS CH
1.0000 | ORAL_TABLET | Freq: Every day | ORAL | Status: DC
  Administered 2021-11-30: 1 via ORAL
  Filled 2021-11-29: qty 1

## 2021-11-29 MED ORDER — ACETAMINOPHEN 500 MG PO TABS
1000.0000 mg | ORAL_TABLET | Freq: Once | ORAL | Status: AC
  Administered 2021-11-29: 1000 mg via ORAL

## 2021-11-29 MED ORDER — ALBUTEROL SULFATE (2.5 MG/3ML) 0.083% IN NEBU: 3.0000 mL | INHALATION_SOLUTION | RESPIRATORY_TRACT | Status: DC | PRN

## 2021-11-29 MED ORDER — OXYCODONE HCL 5 MG/5ML PO SOLN: 5.0000 mg | Freq: Once | ORAL | Status: DC | PRN

## 2021-11-29 MED ORDER — PROPOFOL 1000 MG/100ML IV EMUL
INTRAVENOUS | Status: AC
  Filled 2021-11-29: qty 100

## 2021-11-29 MED ORDER — FENTANYL CITRATE (PF) 100 MCG/2ML IJ SOLN
INTRAMUSCULAR | Status: AC
  Filled 2021-11-29: qty 2

## 2021-11-29 MED ORDER — CEFAZOLIN SODIUM-DEXTROSE 2-4 GM/100ML-% IV SOLN
2.0000 g | INTRAVENOUS | Status: AC
  Administered 2021-11-29 (×2): 2 g via INTRAVENOUS

## 2021-11-29 MED ORDER — GABAPENTIN 300 MG PO CAPS
300.0000 mg | ORAL_CAPSULE | Freq: Once | ORAL | Status: AC
  Administered 2021-11-29: 300 mg via ORAL

## 2021-11-29 MED ORDER — ONDANSETRON HCL 4 MG/2ML IJ SOLN
INTRAMUSCULAR | Status: DC | PRN
  Administered 2021-11-29: 4 mg via INTRAVENOUS

## 2021-11-29 MED ORDER — OXYCODONE HCL 5 MG PO TABS
10.0000 mg | ORAL_TABLET | ORAL | Status: DC | PRN
  Administered 2021-11-30: 15 mg via ORAL
  Administered 2021-11-30: 10 mg via ORAL
  Filled 2021-11-29: qty 3

## 2021-11-29 MED ORDER — PROPOFOL 10 MG/ML IV BOLUS
INTRAVENOUS | Status: AC
  Filled 2021-11-29: qty 20

## 2021-11-29 MED ORDER — KETOROLAC TROMETHAMINE 30 MG/ML IJ SOLN
INTRAMUSCULAR | Status: AC
  Administered 2021-11-29: 30 mg
  Filled 2021-11-29: qty 1

## 2021-11-29 MED ORDER — DEXAMETHASONE SODIUM PHOSPHATE 10 MG/ML IJ SOLN
INTRAMUSCULAR | Status: DC | PRN
  Administered 2021-11-29: 8 mg via INTRAVENOUS

## 2021-11-29 MED ORDER — ONDANSETRON HCL 4 MG/2ML IJ SOLN
4.0000 mg | Freq: Four times a day (QID) | INTRAMUSCULAR | Status: DC | PRN
  Administered 2021-11-29 – 2021-11-30 (×3): 4 mg via INTRAVENOUS
  Filled 2021-11-29 (×3): qty 2

## 2021-11-29 MED ORDER — MIDAZOLAM HCL 2 MG/2ML IJ SOLN
INTRAMUSCULAR | Status: AC
  Filled 2021-11-29: qty 2

## 2021-11-29 MED ORDER — HYDROMORPHONE HCL 1 MG/ML IJ SOLN
INTRAMUSCULAR | Status: DC | PRN
  Administered 2021-11-29: 1 mg via INTRAVENOUS

## 2021-11-29 MED ORDER — LIDOCAINE HCL (CARDIAC) PF 100 MG/5ML IV SOSY
PREFILLED_SYRINGE | INTRAVENOUS | Status: DC | PRN
  Administered 2021-11-29: 100 mg via INTRAVENOUS

## 2021-11-29 MED ORDER — BUPIVACAINE LIPOSOME 1.3 % IJ SUSP
INTRAMUSCULAR | Status: AC
  Filled 2021-11-29: qty 20

## 2021-11-29 MED ORDER — DIPHENHYDRAMINE HCL 12.5 MG/5ML PO ELIX: 12.5000 mg | ORAL_SOLUTION | ORAL | Status: DC | PRN

## 2021-11-29 MED ORDER — FENTANYL CITRATE (PF) 100 MCG/2ML IJ SOLN
INTRAMUSCULAR | Status: AC
  Administered 2021-11-29: 25 ug via INTRAVENOUS
  Filled 2021-11-29: qty 2

## 2021-11-29 MED ORDER — CELECOXIB 200 MG PO CAPS
ORAL_CAPSULE | ORAL | Status: AC
  Filled 2021-11-29: qty 1

## 2021-11-29 MED ORDER — KETAMINE HCL 10 MG/ML IJ SOLN
INTRAMUSCULAR | Status: DC | PRN
  Administered 2021-11-29 (×3): 10 mg via INTRAVENOUS

## 2021-11-29 MED ORDER — EPINEPHRINE PF 1 MG/ML IJ SOLN
INTRAMUSCULAR | Status: AC
  Filled 2021-11-29: qty 4

## 2021-11-29 MED ORDER — PROPOFOL 10 MG/ML IV BOLUS
INTRAVENOUS | Status: DC | PRN
  Administered 2021-11-29: 150 mg via INTRAVENOUS
  Administered 2021-11-29: 125 ug/kg/min via INTRAVENOUS
  Administered 2021-11-29: 50 mg via INTRAVENOUS

## 2021-11-29 MED ORDER — KETAMINE HCL 50 MG/5ML IJ SOSY
PREFILLED_SYRINGE | INTRAMUSCULAR | Status: AC
  Filled 2021-11-29: qty 5

## 2021-11-29 MED ORDER — DOCUSATE SODIUM 100 MG PO CAPS
100.0000 mg | ORAL_CAPSULE | Freq: Two times a day (BID) | ORAL | Status: DC
  Administered 2021-11-29 – 2021-11-30 (×2): 100 mg via ORAL
  Filled 2021-11-29 (×2): qty 1

## 2021-11-29 MED ORDER — METHOCARBAMOL 1000 MG/10ML IJ SOLN
500.0000 mg | Freq: Four times a day (QID) | INTRAVENOUS | Status: DC | PRN
  Administered 2021-11-29: 500 mg via INTRAVENOUS
  Filled 2021-11-29: qty 5
  Filled 2021-11-29: qty 500

## 2021-11-29 MED ORDER — ACETAMINOPHEN 500 MG PO TABS
ORAL_TABLET | ORAL | Status: AC
  Filled 2021-11-29: qty 2

## 2021-11-29 MED ORDER — CHLORHEXIDINE GLUCONATE 0.12 % MT SOLN
15.0000 mL | Freq: Once | OROMUCOSAL | Status: AC
  Administered 2021-11-29: 15 mL via OROMUCOSAL

## 2021-11-29 MED ORDER — SODIUM CHLORIDE 0.9 % IV SOLN: INTRAVENOUS | Status: DC

## 2021-11-29 MED ORDER — NAPROXEN 500 MG PO TABS
500.0000 mg | ORAL_TABLET | Freq: Two times a day (BID) | ORAL | Status: DC
  Filled 2021-11-29: qty 1

## 2021-11-29 MED ORDER — FAMOTIDINE 20 MG PO TABS
ORAL_TABLET | ORAL | Status: AC
  Filled 2021-11-29: qty 1

## 2021-11-29 MED ORDER — ACETAMINOPHEN 500 MG PO TABS
1000.0000 mg | ORAL_TABLET | Freq: Three times a day (TID) | ORAL | Status: DC
  Administered 2021-11-29 – 2021-11-30 (×2): 1000 mg via ORAL
  Filled 2021-11-29 (×2): qty 2

## 2021-11-29 MED ORDER — OXYCODONE HCL 5 MG PO TABS: 5.0000 mg | ORAL_TABLET | Freq: Once | ORAL | Status: DC | PRN

## 2021-11-29 MED ORDER — HYDROMORPHONE HCL 1 MG/ML IJ SOLN: 0.2000 mg | INTRAMUSCULAR | Status: DC | PRN

## 2021-11-29 MED ORDER — GABAPENTIN 300 MG PO CAPS
ORAL_CAPSULE | ORAL | Status: AC
  Filled 2021-11-29: qty 1

## 2021-11-29 MED ORDER — SCOPOLAMINE 1 MG/3DAYS TD PT72
1.0000 | MEDICATED_PATCH | TRANSDERMAL | Status: DC
  Administered 2021-11-29: 1.5 mg via TRANSDERMAL

## 2021-11-29 MED ORDER — SCOPOLAMINE 1 MG/3DAYS TD PT72
MEDICATED_PATCH | TRANSDERMAL | Status: AC
  Filled 2021-11-29: qty 1

## 2021-11-29 MED ORDER — SENNOSIDES-DOCUSATE SODIUM 8.6-50 MG PO TABS: 1.0000 | ORAL_TABLET | Freq: Every evening | ORAL | Status: DC | PRN

## 2021-11-29 MED ORDER — LORATADINE 10 MG PO TABS
10.0000 mg | ORAL_TABLET | Freq: Every day | ORAL | Status: DC
  Administered 2021-11-30: 10 mg via ORAL
  Filled 2021-11-29: qty 1

## 2021-11-29 MED ORDER — ACETAMINOPHEN 325 MG PO TABS: 325.0000 mg | ORAL_TABLET | Freq: Four times a day (QID) | ORAL | Status: DC | PRN

## 2021-11-29 MED ORDER — LACTATED RINGERS IV SOLN: INTRAVENOUS | Status: DC

## 2021-11-29 MED ORDER — ONDANSETRON HCL 4 MG PO TABS: 4.0000 mg | ORAL_TABLET | Freq: Four times a day (QID) | ORAL | Status: DC | PRN

## 2021-11-29 MED ORDER — MIDAZOLAM HCL 2 MG/2ML IJ SOLN
INTRAMUSCULAR | Status: DC | PRN
  Administered 2021-11-29: 2 mg via INTRAVENOUS

## 2021-11-29 MED ORDER — SUMATRIPTAN SUCCINATE 50 MG PO TABS: 25.0000 mg | ORAL_TABLET | ORAL | Status: DC | PRN

## 2021-11-29 MED ORDER — LACTATED RINGERS IR SOLN
Status: DC | PRN
  Administered 2021-11-29: 3000 mL
  Administered 2021-11-29 (×9): 6000 mL
  Administered 2021-11-29: 3000 mL

## 2021-11-29 MED ORDER — BUPIVACAINE LIPOSOME 1.3 % IJ SUSP
INTRAMUSCULAR | Status: DC | PRN
  Administered 2021-11-29: 30 mL

## 2021-11-29 MED ORDER — CHLORHEXIDINE GLUCONATE 0.12 % MT SOLN
OROMUCOSAL | Status: AC
  Filled 2021-11-29: qty 15

## 2021-11-29 MED ORDER — SERTRALINE HCL 50 MG PO TABS
100.0000 mg | ORAL_TABLET | Freq: Every day | ORAL | Status: DC
  Administered 2021-11-30: 100 mg via ORAL
  Filled 2021-11-29: qty 2

## 2021-11-29 MED ORDER — OXYCODONE HCL 5 MG PO TABS
5.0000 mg | ORAL_TABLET | ORAL | Status: DC | PRN
  Administered 2021-11-29: 10 mg via ORAL
  Filled 2021-11-29 (×2): qty 2

## 2021-11-29 MED ORDER — CELECOXIB 200 MG PO CAPS
200.0000 mg | ORAL_CAPSULE | Freq: Once | ORAL | Status: AC
  Administered 2021-11-29: 200 mg via ORAL

## 2021-11-29 MED ORDER — BUPIVACAINE HCL (PF) 0.5 % IJ SOLN
INTRAMUSCULAR | Status: AC
  Filled 2021-11-29: qty 30

## 2021-11-29 MED ORDER — CEFAZOLIN SODIUM-DEXTROSE 2-4 GM/100ML-% IV SOLN
2.0000 g | Freq: Four times a day (QID) | INTRAVENOUS | Status: AC
  Administered 2021-11-29 (×2): 2 g via INTRAVENOUS
  Filled 2021-11-29 (×3): qty 100

## 2021-11-29 MED ORDER — LACTATED RINGERS IR SOLN
Status: DC | PRN
  Administered 2021-11-29: 12000 mL

## 2021-11-29 MED ORDER — KETOROLAC TROMETHAMINE 15 MG/ML IJ SOLN
30.0000 mg | Freq: Three times a day (TID) | INTRAMUSCULAR | Status: DC
  Administered 2021-11-29 – 2021-11-30 (×2): 30 mg via INTRAVENOUS
  Filled 2021-11-29 (×2): qty 2

## 2021-11-29 MED ORDER — METHOCARBAMOL 500 MG PO TABS: 500.0000 mg | ORAL_TABLET | Freq: Four times a day (QID) | ORAL | Status: DC | PRN

## 2021-11-29 MED ORDER — ASPIRIN 325 MG PO TBEC
325.0000 mg | DELAYED_RELEASE_TABLET | Freq: Every day | ORAL | Status: DC
  Administered 2021-11-29 – 2021-11-30 (×2): 325 mg via ORAL
  Filled 2021-11-29 (×2): qty 1

## 2021-11-29 MED ORDER — ROCURONIUM BROMIDE 100 MG/10ML IV SOLN
INTRAVENOUS | Status: DC | PRN
  Administered 2021-11-29: 20 mg via INTRAVENOUS
  Administered 2021-11-29: 60 mg via INTRAVENOUS
  Administered 2021-11-29 (×3): 20 mg via INTRAVENOUS

## 2021-11-29 MED ORDER — CEFAZOLIN SODIUM-DEXTROSE 2-4 GM/100ML-% IV SOLN
INTRAVENOUS | Status: AC
  Administered 2021-11-30: 2 g via INTRAVENOUS
  Filled 2021-11-29: qty 100

## 2021-11-29 MED ORDER — FAMOTIDINE 20 MG PO TABS
20.0000 mg | ORAL_TABLET | Freq: Once | ORAL | Status: AC
  Administered 2021-11-29: 20 mg via ORAL

## 2021-11-29 MED ORDER — HYDROMORPHONE HCL 1 MG/ML IJ SOLN
INTRAMUSCULAR | Status: AC
  Filled 2021-11-29: qty 1

## 2021-11-29 MED ORDER — INFLUENZA VAC SPLIT QUAD 0.5 ML IM SUSY
0.5000 mL | PREFILLED_SYRINGE | INTRAMUSCULAR | Status: AC
  Administered 2021-11-30: 0.5 mL via INTRAMUSCULAR
  Filled 2021-11-29: qty 0.5

## 2021-11-29 MED ORDER — FENTANYL CITRATE (PF) 100 MCG/2ML IJ SOLN
INTRAMUSCULAR | Status: DC | PRN
  Administered 2021-11-29 (×2): 25 ug via INTRAVENOUS
  Administered 2021-11-29: 50 ug via INTRAVENOUS

## 2021-11-29 MED ORDER — FENTANYL CITRATE (PF) 100 MCG/2ML IJ SOLN
25.0000 ug | INTRAMUSCULAR | Status: DC | PRN
  Administered 2021-11-29: 25 ug via INTRAVENOUS

## 2021-11-29 MED ORDER — ACETAMINOPHEN 10 MG/ML IV SOLN
INTRAVENOUS | Status: AC
  Administered 2021-11-29: 10 mg
  Filled 2021-11-29: qty 100

## 2021-11-29 MED ORDER — ORAL CARE MOUTH RINSE: 15.0000 mL | Freq: Once | OROMUCOSAL | Status: AC

## 2021-11-29 SURGICAL SUPPLY — 66 items
ADAPTER IRRIG TUBE 2 SPIKE SOL (ADAPTER) ×2 IMPLANT
ANCHOR SUT 1.4 FLEX (Anchor) IMPLANT
ANCHOR SUT 2.4 SS KNTLS #1 (Anchor) IMPLANT
BIT DRILL FLEX NANOTACK (BIT) IMPLANT
BIT DRILL SS CINCHLOCK (BIT) ×1 IMPLANT
BLADE SAMURAI STR FULL RADIUS (BLADE) ×1 IMPLANT
BLADE SURG SZ11 CARB STEEL (BLADE) ×1 IMPLANT
BNDG ADH 1X3 SHEER STRL LF (GAUZE/BANDAGES/DRESSINGS) ×5 IMPLANT
BUR 4.0 ROUND XL DIAMOND (BUR) ×1 IMPLANT
BUR 5.5 ROUND LONG FS 8 FLUTE (BUR) ×1 IMPLANT
CANNULA 8 456 TRANSPORT (CANNULA) IMPLANT
CANNULA 8 789 TRANSPORT (CANNULA) ×1 IMPLANT
CANNULA OBTURATOR FLOWPORT (CANNULA) ×1 IMPLANT
CHLORAPREP W/TINT 26 (MISCELLANEOUS) ×1 IMPLANT
COOLER POLAR GLACIER W/PUMP (MISCELLANEOUS) IMPLANT
CUTTER AGGRESSIVE PLUS 4D 180L (CUTTER) ×1 IMPLANT
DEVICE SUCT BLK HOLE OR FLOOR (MISCELLANEOUS) ×1 IMPLANT
DRAPE 3/4 80X56 (DRAPES) IMPLANT
DRAPE ARTHRO LIMB 89X125 STRL (DRAPES) ×1 IMPLANT
DRAPE C-ARM 42X72 X-RAY (DRAPES) ×1 IMPLANT
DRAPE SURG 17X11 SM STRL (DRAPES) ×1 IMPLANT
GAUZE SPONGE 4X4 12PLY STRL (GAUZE/BANDAGES/DRESSINGS) ×1 IMPLANT
GLOVE BIO SURGEON STRL SZ7.5 (GLOVE) ×2 IMPLANT
GLOVE BIOGEL PI IND STRL 8 (GLOVE) ×1 IMPLANT
GLOVE SURG ORTHO 8.0 STRL STRW (GLOVE) ×2 IMPLANT
GLOVE SURG UNDER LTX SZ8 (GLOVE) ×1 IMPLANT
GOWN STRL REUS W/ TWL LRG LVL3 (GOWN DISPOSABLE) ×1 IMPLANT
GOWN STRL REUS W/ TWL XL LVL3 (GOWN DISPOSABLE) ×1 IMPLANT
GOWN STRL REUS W/TWL LRG LVL3 (GOWN DISPOSABLE) ×1
GOWN STRL REUS W/TWL XL LVL3 (GOWN DISPOSABLE) ×1
IV LACTATED RINGER IRRG 3000ML (IV SOLUTION) ×24
IV LR IRRIG 3000ML ARTHROMATIC (IV SOLUTION) ×6 IMPLANT
KIT PATIENT POSITION MEDIUM (KITS) ×1 IMPLANT
KIT PORTAL ENTRY HIP ACCESS (KITS) ×1 IMPLANT
MANIFOLD NEPTUNE II (INSTRUMENTS) ×2 IMPLANT
MAT ABSORB  FLUID 56X50 GRAY (MISCELLANEOUS) ×1
MAT ABSORB FLUID 56X50 GRAY (MISCELLANEOUS) ×1 IMPLANT
NDL INJECTOR II CARTRIDGE (MISCELLANEOUS) ×1 IMPLANT
NDL SAFETY ECLIP 18X1.5 (MISCELLANEOUS) ×1 IMPLANT
NEEDLE INJECTOR II CARTRIDGE (MISCELLANEOUS) ×1 IMPLANT
PACK ARTHROSCOPY KNEE (MISCELLANEOUS) ×2 IMPLANT
PAD ABD DERMACEA PRESS 5X9 (GAUZE/BANDAGES/DRESSINGS) ×1 IMPLANT
PAD ARMBOARD 7.5X6 YLW CONV (MISCELLANEOUS) ×1 IMPLANT
PAD WRAPON POLOR MULTI XL (MISCELLANEOUS) IMPLANT
PASSER SUT 1.5D CRESCENT (INSTRUMENTS) ×1 IMPLANT
PASSER SUT 70D UP ANGLED (INSTRUMENTS) ×1 IMPLANT
SERFAS 50-S SWEEP XL (INSTRUMENTS) ×3
SLEEVE REMOTE CONTROL 5X12 (DRAPES) IMPLANT
SPONGE T-LAP 18X18 ~~LOC~~+RFID (SPONGE) ×1 IMPLANT
SUT ETHILON 3-0 FS-10 30 BLK (SUTURE) ×1
SUT VIC AB 2-0 CT2 27 (SUTURE) ×1 IMPLANT
SUT XBRAID 1.4 BLK/WHT (SUTURE) IMPLANT
SUT XBRAID 1.4 WHITE/BLUE (SUTURE) IMPLANT
SUT ZIPLINE SZ2 BLK (SUTURE) ×3 IMPLANT
SUT ZIPLINE SZ2 GREEN (SUTURE) ×6 IMPLANT
SUTURE EHLN 3-0 FS-10 30 BLK (SUTURE) ×1 IMPLANT
SUTURE TAPE XBRAID 1.2 BLUE 45 (SUTURE) IMPLANT
SUTURETAPE XBRAID 1.2 BLUE 45 (SUTURE) ×1
TRAP FLUID SMOKE EVACUATOR (MISCELLANEOUS) ×1 IMPLANT
TRAY FOLEY SLVR 16FR LF STAT (SET/KITS/TRAYS/PACK) ×1 IMPLANT
TUBING INFLOW SET DBFLO PUMP (TUBING) ×1 IMPLANT
TUBING OUTFLOW SET DBLFO PUMP (TUBING) ×1 IMPLANT
WAND SERFAS 50-S SWEEP XL (INSTRUMENTS) ×1 IMPLANT
WATER STERILE IRR 500ML POUR (IV SOLUTION) ×1 IMPLANT
WRAP-ON POLOR PAD MULTI XL (MISCELLANEOUS) ×1
WRAPON POLOR PAD MULTI XL (MISCELLANEOUS) ×1

## 2021-11-29 NOTE — Anesthesia Procedure Notes (Signed)
Procedure Name: Intubation Date/Time: 11/29/2021 7:41 AM  Performed by: Lowry Bowl, CRNAPre-anesthesia Checklist: Patient identified, Emergency Drugs available, Suction available and Patient being monitored Patient Re-evaluated:Patient Re-evaluated prior to induction Oxygen Delivery Method: Circle system utilized Preoxygenation: Pre-oxygenation with 100% oxygen Induction Type: IV induction Ventilation: Mask ventilation without difficulty Laryngoscope Size: 3 and McGraph Grade View: Grade I Tube type: Oral Tube size: 6.5 mm Number of attempts: 1 Airway Equipment and Method: Stylet and Video-laryngoscopy (videoscope used electively) Placement Confirmation: ETT inserted through vocal cords under direct vision, positive ETCO2 and breath sounds checked- equal and bilateral Secured at: 21 cm Tube secured with: Tape Dental Injury: Teeth and Oropharynx as per pre-operative assessment

## 2021-11-29 NOTE — H&P (Signed)
Paper H&P to be scanned into permanent record. H&P reviewed. No significant changes noted.  

## 2021-11-29 NOTE — Discharge Instructions (Signed)
Hip Arthroscopy Post-Operative Instructions  1. Physical Therapy should start within 3-4 days of surgery. If your therapist has ANY questions, please ask them to call our offices. 2. If oozing from surgery site occurs, and the dressing appears soaked with bloody fluid please change the dressing as needed. This normally occurs after fluid irrigation during surgery, and will resolve within 24-36 hours. 3. Icing is very important for the first 5-7 days postoperative, and ice is applied (ice packs or ice therapy) as often as possible or at least for 20-minute periods 3-4 times per day. Ice should not be applied directly on the skin. 4. Physical therapist will remove dressing at 1st PT visit. 5. Apply Band-Aids to wound sites and change them once a day. Keep the wound clean and dry. 6. Please do not use bacitracin or other ointments under the bandage. 7. Showering is allowed on post-op day #4 if the wound is dry. MAKE SURE EACH INCISION IS COVERED WITH A WATERPROOF BANDAID DURING SHOWER ONLY! 8. Do not soak the hip in water in a bathtub or pool until the sutures are removed. Typically getting into a bath or pool is permitted 4 weeks after surgery.  9. Driving is permitted after 1 week for L hip surgery only if the narcotic pain medication is no longer being taken and you feel comfortable getting into and out of a car. For R hip surgery, driving is permitted after 2 weeks. Driving a manual car may take up to 3-4 weeks. 10. Please ensure you have a follow-up appointment for suture removal ~2 weeks after surgery.  11. The anesthetic drugs used during your surgery may cause nausea for the first 24 hours. If nausea is encountered, drink only clear liquids (i.e. Sprite or 7-up). The only solids should be dry crackers or toast. If nausea and vomiting become severe or the patient shows sign of dehydration (lack of urination) please call the doctor or the surgicenter. 12. If you develop a fever (101.5), redness, or  yellow/brown/green drainage from the surgical incision site, please call our office to arrange for an evaluation.  13: POST-OPERATIVE PRESCRIPTIONS:  HETERTOPIC BONE PROPHYLAXIS FOR 30 DAYS: 1. EC-Naprosyn 500mg, 1 tablet by mouth two times per day x 30 days 2. Prilosec (Stomach Prophylaxis) 20mg, 1 tablet by mouth daily (take on an empty stomach x 30 days  DVT PROPHYLAXIS 3. Aspirin 325mg by mouth daily x 2 weeks  PAIN MEDICATION:  4. Oxycodone 1 to 3 tablets by mouth every 4 hours as needed 5. Tylenol 1000mg three times day for at least 5 days, then as needed to reduce narcotics  ANTI-NAUSEA (if applicable):  6. Zofran 4mg tablet, 1 tablet every 6 hours as needed. You will be given a prescription, but it is optional to fill it.  ANTI-SPASM (if applicable):  7. Zanaflex 4mg, 2 tablets by mouth every 6 hours as needed.  ANTI-CONSTIPATION 8. Colace 100mg, 2 tablets by mouth daily (to prevent constipation with use of narcotic medications)  14. You will take as aspirin (325 mg) daily x 2 weeks. This may lower the risk of a blood clot developing after surgery. Should severe calf pain occur or significant swelling of calf and ankle, please call our offices. 15. Local anesthetics (i.e. Novocaine) are put into the incision after surgery. It is not uncommon for patients to encounter more pain on the first or second day after surgery. This is the time when swelling peaks. Taking pain medication before bedtime will assist in sleeping. It   is important not to drink or drive while taking narcotic medication. You should resume your normal medications for other conditions the day after surgery. 16. Follow weight bearing instructions as advised at discharge. Crutches may be necessary to assist walking. Extremity elevation for the first 72 hours is also encouraged to minimize swelling. 17. If unexpected problems occur and you need to speak to the doctor, call the office.   Important Contact  Information Roselia Fuentes (Staff Assistant): [phone] Fax Number: [phone]    

## 2021-11-29 NOTE — OR Nursing (Signed)
Right hip arthroscopy traction up 0808 - down 0850. Traction up 0845 - down 1128.

## 2021-11-29 NOTE — Transfer of Care (Signed)
Immediate Anesthesia Transfer of Care Note  Patient: Alison Owens  Procedure(s) Performed: ARTHROSCOPY HIP, LABRAL REPAIR (Right: Hip)  Patient Location: PACU  Anesthesia Type:General  Level of Consciousness: awake, drowsy and patient cooperative  Airway & Oxygen Therapy: Patient Spontanous Breathing  Post-op Assessment: Report given to RN, Post -op Vital signs reviewed and stable and Patient moving all extremities  Post vital signs: Reviewed and stable  Last Vitals:  Vitals Value Taken Time  BP 103/75 11/29/21 1315  Temp    Pulse 66 11/29/21 1316  Resp 11 11/29/21 1316  SpO2 96 % 11/29/21 1316  Vitals shown include unvalidated device data.  Last Pain:  Vitals:   11/29/21 0631  TempSrc: Oral  PainSc: 2          Complications: No notable events documented.

## 2021-11-29 NOTE — Plan of Care (Signed)
  Problem: Activity: Goal: Risk for activity intolerance will decrease Outcome: Progressing   Problem: Coping: Goal: Level of anxiety will decrease Outcome: Progressing   Problem: Pain Managment: Goal: General experience of comfort will improve Outcome: Progressing   Problem: Safety: Goal: Ability to remain free from injury will improve Outcome: Progressing   

## 2021-11-29 NOTE — Anesthesia Postprocedure Evaluation (Signed)
Anesthesia Post Note  Patient: Alison Owens  Procedure(s) Performed: ARTHROSCOPY HIP, LABRAL REPAIR (Right: Hip)  Patient location during evaluation: PACU Anesthesia Type: General Level of consciousness: awake and alert Pain management: pain level controlled Vital Signs Assessment: post-procedure vital signs reviewed and stable Respiratory status: spontaneous breathing, nonlabored ventilation, respiratory function stable and patient connected to nasal cannula oxygen Cardiovascular status: blood pressure returned to baseline and stable Postop Assessment: no apparent nausea or vomiting Anesthetic complications: no   No notable events documented.   Last Vitals:  Vitals:   11/29/21 1415 11/29/21 1430  BP: 104/61 99/65  Pulse: (!) 59 62  Resp: 14 14  Temp: 36.7 C   SpO2: 97% 98%    Last Pain:  Vitals:   11/29/21 1430  TempSrc:   PainSc: [name]

## 2021-11-29 NOTE — Op Note (Signed)
Operative Note    SURGERY DATE: 11/29/2021   PRE-OP DIAGNOSIS:  1. Right femoroacetabular impingement 2. Right hip labral tear   POST-OP DIAGNOSIS:  1. Right femoroacetabular impingement 2. Right hip labral tear   PROCEDURES:  1. Right hip arthroscopy with acetabuloplasty, labral repair, femoral osteochondroplasty, and capsular closure   SURGEON: Rosealee Albee, MD  ASSISTANT: Dedra Skeens, PA; Rayburn Go, PA-S    ANESTHESIA: Gen   ESTIMATED BLOOD LOSS: minimal   TOTAL IV FLUIDS: per anesthesia  INDICATION(S): The patient is a 41 y.o. year old female who presents with persistent hip pain.  Radiographs demonstrated FAI morphology and the MRI revealed a labral tear.  She has failed greater than 3 months of non-operative treatment to date including activity modifications, physical therapy, and corticosteroid injection.  Please see the preoperative notes for further detail.   She elected to undergo the above mentioned procedure after detailed explanation of the expected outcomes and recovery path.  Of note, she has had prior contralateral left hip arthroscopy by me on 12/24/2017 and has done well from that surgery.  Informed consent was obtained outlining the expected benefits and possible risks of the surgery including a less than 5% chance of numbness in the sciatic or pudendal nerve regions, 20% chance of injury to the lateral femoral cutaneous nerve (1% permanent injury). 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  OPERATIVE FINDINGS: Cartilage No significant degenerative changes of the acetabular cartilage along the labral tear  High grade cartilage lesion: no Delamination: no Bone exposed: no Bruising: no Localization of femoral head high grade lesion: none  Cotyloid fossa osteophytes:  none The remainder of the femoral and acetabular cartilage was  normal.  Labrum Labral degeneration, yellow over 50% of labrum: no Complexity of tearing:  Tear at chondrolabral junction with wave sign Hypoplastic labrum: no Hyperplastic labrum: Yes from 10:00-12:00 Lipstick sign at the psoas prominence: none Psoas Prominence: no  Boundaries of labral tear Convention (3 o'clock anterior, 9 o'clock posterior) Anterior boundary: 1 o'clock Posterior boundary: 3 o'clock There was further tear at the 1030-12 o'clock position  Ligamentum teres Hypertrophy:  no Tear: no   OPERATIVE REPORT:  The patient was brought to the operating room, placed supine on the operating table, and bony prominences were padded.  The traction boots were applied with padding to ensure that safe traction could be applied through the feet.  The contralateral limb was abducted slightly and light traction was applied.  The operative leg was brought into neutral position.  Appropriate preoperative IV antibiotics were administered. The patient was prepped and draped in a sterile fashion.  Time-out was performed and landmarks were identified. An air arthrogram was obtained by injecting 30cc into the hip joint while traction was pulled. This broke the labral seal allowing for distraction of the hip. Care was taken to ensure the least amount of force necessary to allow safe access to the joint of 8-33mm.  This was checked with fluoroscopy.   Next we placed an anterolateral portal under the assistance of fluoroscopy.  First, fluoroscopy was used to estimate the trajectory and starting point.  A 9mm incision with a #11 blade was made and a straight hemostat was used to dilate the portal through the appropriate tract.  We then placed a 14-gauge hypodermic needle with careful technique to be as close to the femoral head as possible and parallel to the sourcil to  ensure no iatrogenic damage to the labrum.  This released the negative pressure environment and the amount of traction was adjusted to  maintain the 8-4mm of distraction.  A nitinol wire was placed through the needle and flouroscopy was used to ensure it extended to the medial wall of the acetabulum.  The Flowport from [employer] was placed over the wire and the nitinol wire was retracted to just inside the capsule during insertion of the dilator and cannula to minimize the risk of breakage. The arthroscope was placed next and we visualized the anterior triangle.  We then placed the anterior portal under direct visualization using the technique described above.  This was safely placed as well without damage to the labrum or femoral head.  We then switched our arthroscope to the anterior portal to ensure we were not through the labrum - we were safely through the capsule only.  We then proceeded with a transverse capsulotomy connecting the 2 portals in the same plane utilizing the Samurai blade from [employer].  The Injector device from [employer] was used to place traction stitches each in the medial and lateral arms of the proximal capsule.  A Kelly clamp was used to hold the suture against the skin to apply traction. This allowed access to the acetabular rim and labrum as well as protection of the native edges of the capsule.  We identified the anterior inferior iliac spine proximally, the psoas tendon medially and the rectus tendon laterally as landmarks.  We then proceeded with a diagnostic arthroscopy - the results can be found in the findings section above. Traction was let down at this time, and the hip was flexed ~30 degrees.   We then used the 50 degree hip specific radiofrequency device and a 23mm shaver to clear the superior acetabulum and expose the subspinous region.  Next we exposed the acetabular rim leaving the chondrolabral junction intact.  Working from both portals, the acetabular rim/subspinous region was reshaped with a 4.3mm diamond burr consistent with the preoperative three-dimensional imaging.   When  adequate reshaping was confirmed with fluoroscopy, we then proceeded with the labral repair.  The hip was placed back into traction. A distal anterolateral portal was placed under direct visualization and the Transport cannula was inserted.  Care was taken to ensure the cannula was in the intermuscular plane between the gluteus minimus and iliocapsularis.  This portal was approximately 4cm distal and 1cm anterior to the anterolateral portal.      We placed 2 anchors at the 1:30 and 2:30 positions with a circumferential stitch at each of the above positions. The sutures were passed using the crescent Nanopass from [employer].  This resulted in anatomic labral repair.  To address the tear further out laterally, [place]ak anchors were placed at the 11:00 and 12:00 positions.  Sutures were passed from the torn labrum in this region and knots were tied arthroscopically, reducing the labrum to the acetabulum.  We debrided the loose cartilage at the rim and residual degenerative labral tissue.  Traction was let down.  We then turned our attention to the peripheral compartment.  We flexed the hip to 45 degrees. We viewed from the anterior portal and worked from the distal anterolateral portal.  First we passed two traction stitches in the medial and lateral sides of the capsulotomy.  Clamps were used to hold these stitches against the skin for retraction.  Adequate mobilization and retraction was achieved such that we could view the entire  CAM lesion. The capsule was retracted with a switching stick through the AL portal when necessary.  We were able to view the medial and lateral synovial folds, identifying the medial and lateral circumflex arteries.  These were protected during the osteoplasty.  A burr was used to reshape the femoral neck.  The initial line of resection was defined with the use of dynamic exam and fluoroscopy.  The line was parallel to the acetabular rim with the leg in neutral rotation.  We viewed  the base of the femoral neck to provide a foundation for the shape and size of the osteochondroplasty. We were able to adequately remove the cam lesion and reshape the femoral neck.  This was confirmed with fluoroscopy.  Dynamic exam showed no residual impingement flexed to 90 degrees with maximal internal rotation and external rotation.  Finally, we performed a complete capsular closure with Zipline suture from [employer], utilizing the Slingshot from [employer] to pass the suture.  4 simple sutures with suture tape were placed across the interportal capsulotomy. These were then tied sequentially with alternating half hitches through an 8.59mm Transport cannula. Reapproximation of the capsule was confirmed.   We then removed the arthroscope and closed the incisions with 2-0 Vicryl subdermally and 3-0 nylon stitches.  Local anesthetic was injected about the portals and tracts down to the joint. A sterile dressing was applied and hip brace was applied.  The patient was awakened from anesthesia and transferred to PACU in stable condition.  Of note, all extremities and available joints were mobilized approximately every 1-2 hours during this surgery to avoid complications from prolonged immobilization.   Of note, assistance from a PA was essential to performing the surgery. PA assisted with patient positioning, retraction, and instrumentation. The surgery would have been more difficult and had longer operative time without PA assistance.   POSTOPERATIVE PLAN: The patient will be admitted overnight for observation.  Flatfoot weightbearing with hip abduction brace x3 weeks.  PT/OT in a.m.  Perioperative antibiotics x24 hours.  Patient will follow-up as an outpatient in 2 weeks.  Outpatient physical therapy to start in 3-4 days.

## 2021-11-29 NOTE — Anesthesia Preprocedure Evaluation (Signed)
Anesthesia Evaluation  Patient identified by MRN, date of birth, ID band Patient awake    Reviewed: Allergy & Precautions, H&P , NPO status , Patient's Chart, lab work & pertinent test results, reviewed documented beta blocker date and time   History of Anesthesia Complications (+) PONV and history of anesthetic complications  Airway Mallampati: II  TM Distance: >3 FB Neck ROM: full    Dental  (+) Teeth Intact, Dental Advidsory Given   Pulmonary neg pulmonary ROS, neg COPD,    Pulmonary exam normal        Cardiovascular Exercise Tolerance: Good (-) angina(-) Past MI and (-) CABG negative cardio ROS Normal cardiovascular exam Rhythm:regular Rate:Normal     Neuro/Psych  Headaches, Anxiety negative psych ROS   GI/Hepatic negative GI ROS, Neg liver ROS,   Endo/Other  negative endocrine ROS  Renal/GU negative Renal ROS  negative genitourinary   Musculoskeletal   Abdominal   Peds  Hematology negative hematology ROS (+)   Anesthesia Other Findings Past Medical History: No date: Anxiety No date: Headache No date: PONV (postoperative nausea and vomiting) Past Surgical History: No date: CESAREAN SECTION BMI    Body Mass Index:  22.86 kg/m     Reproductive/Obstetrics negative OB ROS                             Anesthesia Physical  Anesthesia Plan  ASA: 2  Anesthesia Plan: General ETT   Post-op Pain Management:    Induction: Intravenous  PONV Risk Score and Plan: 4 or greater and Ondansetron, Dexamethasone, Propofol infusion, TIVA, Midazolam and Scopolamine patch - Pre-op  Airway Management Planned: Oral ETT  Additional Equipment:   Intra-op Plan:   Post-operative Plan: Extubation in OR  Informed Consent: I have reviewed the patients History and Physical, chart, labs and discussed the procedure including the risks, benefits and alternatives for the proposed anesthesia with the  patient or authorized representative who has indicated his/her understanding and acceptance.     Dental Advisory Given  Plan Discussed with: Anesthesiologist, CRNA and Surgeon  Anesthesia Plan Comments: (Patient consented for risks of anesthesia including but not limited to:  - adverse reactions to medications - damage to eyes, teeth, lips or other oral mucosa - nerve damage due to positioning  - sore throat or hoarseness - Damage to heart, brain, nerves, lungs, other parts of body or loss of life  Patient voiced understanding.)        Anesthesia Quick Evaluation

## 2021-11-30 ENCOUNTER — Encounter: Payer: Self-pay | Admitting: Orthopedic Surgery

## 2021-11-30 DIAGNOSIS — S73191A Other sprain of right hip, initial encounter: Secondary | ICD-10-CM | POA: Diagnosis not present

## 2021-11-30 LAB — HIV ANTIBODY (ROUTINE TESTING W REFLEX): HIV Screen 4th Generation wRfx: NONREACTIVE

## 2021-11-30 MED ORDER — METHOCARBAMOL 500 MG PO TABS: 500.0000 mg | ORAL_TABLET | Freq: Four times a day (QID) | ORAL | 0 refills | Status: AC | PRN

## 2021-11-30 MED ORDER — ONDANSETRON HCL 4 MG PO TABS: 4.0000 mg | ORAL_TABLET | Freq: Four times a day (QID) | ORAL | 0 refills | Status: AC | PRN

## 2021-11-30 MED ORDER — OXYCODONE HCL 5 MG PO TABS: 5.0000 mg | ORAL_TABLET | ORAL | 0 refills | Status: AC | PRN

## 2021-11-30 MED ORDER — ASPIRIN 325 MG PO TBEC: 325.0000 mg | DELAYED_RELEASE_TABLET | Freq: Every day | ORAL | Status: AC

## 2021-11-30 NOTE — Discharge Summary (Signed)
Physician Discharge Summary  Subjective: 1 Day Post-Op Procedure(s) (LRB): ARTHROSCOPY HIP, LABRAL REPAIR (Right) Patient reports pain as moderate.   Patient seen in rounds with Dr. Allena Katz. Patient is well, and has had no acute complaints or problems Patient is ready to [facility] home after physical therapy  Physician Discharge Summary  Patient ID: Alison Owens MRN: [id number] DOB/AGE: [birth year] 39 y.o.  Admit date: 11/29/2021 Discharge date: 11/30/2021  Admission Diagnoses:  Discharge Diagnoses:  Principal Problem:   Femoroacetabular impingement   Discharged Condition: good  Hospital Course: The patient is postop day 1 from a right hip arthroscopy.  She is doing well since surgery.  She has been up to the bedside commode several times.  She has good pain control.  Her vitals have remained stable.  She is ready to [facility] home after physical therapy this morning.  Treatments: surgery:  1. Right hip arthroscopy with acetabuloplasty, labral repair, femoral osteochondroplasty, and capsular closure   SURGEON: Alison Albee, MD   ASSISTANT: Alison Skeens, PA; Alison Go, PA-S    ANESTHESIA: Gen   ESTIMATED BLOOD LOSS: minimal   TOTAL IV FLUIDS: per anesthesia  Discharge Exam: Blood pressure 107/73, pulse 60, temperature 98.6 F (37 C), resp. rate 17, height 5\' 2"  (1.575 m), weight 63.5 kg, SpO2 95 %.   Disposition: Discharge disposition: 01-Home or Self Care        Allergies as of 11/30/2021   No Known Allergies      Medication List     TAKE these medications    aspirin EC 325 MG tablet Take 1 tablet (325 mg total) by mouth daily.   cetirizine 10 MG tablet Commonly known as: ZYRTEC Take 10 mg by mouth every morning.   methocarbamol 500 MG tablet Commonly known as: ROBAXIN Take 1 tablet (500 mg total) by mouth every 6 (six) hours as needed for muscle spasms.   multivitamin with minerals tablet Take 1 tablet by mouth daily.   ondansetron 4 MG  tablet Commonly known as: ZOFRAN Take 1 tablet (4 mg total) by mouth every 6 (six) hours as needed for nausea.   oxyCODONE 5 MG immediate release tablet Commonly known as: Oxy IR/ROXICODONE Take 1-2 tablets (5-10 mg total) by mouth every 4 (four) hours as needed for moderate pain (pain score 4-6).   ProAir HFA 108 (90 Base) MCG/ACT inhaler Generic drug: albuterol Inhale 2 puffs into the lungs every 4 (four) hours as needed for wheezing or shortness of breath.   sertraline 100 MG tablet Commonly known as: ZOLOFT Take 100 mg by mouth daily.   SUMAtriptan 25 MG tablet Commonly known as: IMITREX Take 25 mg by mouth every 2 (two) hours as needed for migraine. May repeat in 2 hours if headache persists or recurs.        Follow-up Information     01/30/2022, MD. [facility] in 2 week(s).   Specialty: Orthopedic Surgery Contact information: [address] [place] [phone]                 Signed: [phone], Alison Owens   Objective: Vital signs in last 24 hours: Temp:  [97.5 F (36.4 C)-98.6 F (37 C)] 98.6 F (37 C) (10/11 0501) Pulse Rate:  [54-81] 60 (10/11 0501) Resp:  [10-22] 17 (10/11 0501) BP: (83-113)/(59-76) 107/73 (10/11 0501) SpO2:  [94 %-99 %] 95 % (10/11 0501)  Intake/Output from previous day:  Intake/Output Summary (Last 24 hours) at 11/30/2021 01/30/2022 Last data filed at  11/30/2021 0129 Gross per 24 hour  Intake 2627.3 ml  Output 2160 ml  Net 467.3 ml    Intake/Output this shift: No intake/output data recorded.  Labs: No results for input(s): "HGB" in the last 72 hours. No results for input(s): "WBC", "RBC", "HCT", "PLT" in the last 72 hours. No results for input(s): "NA", "K", "CL", "CO2", "BUN", "CREATININE", "GLUCOSE", "CALCIUM" in the last 72 hours. No results for input(s): "LABPT", "INR" in the last 72 hours.  EXAM: General - Patient is Alert and Oriented Extremity - Neurovascular intact Sensation intact  distally Dorsiflexion/Plantar flexion intact Compartment soft Incision - clean, dry, no drainage, with the abduction brace in place Motor Function -plantarflexion and dorsiflexion are intact.  Assessment/Plan: 1 Day Post-Op Procedure(s) (LRB): ARTHROSCOPY HIP, LABRAL REPAIR (Right) Procedure(s) (LRB): ARTHROSCOPY HIP, LABRAL REPAIR (Right) Past Medical History:  Diagnosis Date   Anxiety    Bronchitis    Carpal tunnel syndrome of right wrist 2019   Femoroacetabular impingement    Headache    PONV (postoperative nausea and vomiting)    Principal Problem:   Femoroacetabular impingement  Estimated body mass index is 25.61 kg/m as calculated from the following:   Height as of this encounter: 5\' 2"  (1.575 m).   Weight as of this encounter: 63.5 kg. Advance diet Up with therapy D/C IV fluids Diet - Regular diet Follow up - in 2 weeks Activity -flatfoot weight-bear on the right with her abduction brace in place Disposition - Home Condition Upon Discharge - Stable DVT Prophylaxis - Aspirin  Alison Dixon, PA-C Orthopaedic Surgery 11/30/2021, 7:14 [facility]

## 2021-11-30 NOTE — Progress Notes (Signed)
Met with the patient and her husband in the room She lives at home with her husband, he provides transportation She is set up with OP pt Has appointment in Friday She needs a RW and 3 in 1 [employer] will deliver to the room

## 2021-11-30 NOTE — Evaluation (Signed)
Physical Therapy Evaluation Patient Details Name: Alison Owens MRN: [id number] DOB: Dec 14, 1980 Today's Date: 11/30/2021  History of Present Illness  Alison Owens comes to [facility] on 10/10 to see Dr. Posey Pronto in the [facility] for FAI issues. Postop pt is FFWB/TDWB in a ABDCT brace Rt.  Clinical Impression  Reviewed weight bearing precautions, movement restrictions, brace usage, DME training. Pt demonstrates excellent mobility potential on all accounts and husband is prepared to assist with anything pt may need help with. Pt elects to use lofstrand crutches for AMB stairs, but does better in tight spaces and short distances with RW. Pt ready for DC to home today from PT standpoint.      Recommendations for follow up therapy are one component of a multi-disciplinary discharge planning process, led by the attending physician.  Recommendations may be updated based on patient status, additional functional criteria and insurance authorization.  Follow Up Recommendations Follow physician's recommendations for discharge plan and follow up therapies      Assistance Recommended at Discharge PRN  Patient can return home with the following  A little help with walking and/or transfers;Help with stairs or ramp for entrance;Assistance with cooking/housework;Assist for transportation    Equipment Recommendations Rolling walker (2 wheels);BSC/3in1  Recommendations for Other Services       Functional Status Assessment Patient has had a recent decline in their functional status and demonstrates the ability to make significant improvements in function in a reasonable and predictable amount of time.     Precautions / Restrictions        Mobility  Bed Mobility Overal bed mobility: Modified Independent                  Transfers Overall transfer level: Modified independent Equipment used: Rolling walker (2 wheels), None                    Ambulation/Gait Ambulation/Gait assistance:  Supervision Gait Distance (Feet): 60 Feet Assistive device: Rolling walker (2 wheels), Lofstrands Gait Pattern/deviations: Step-to pattern       General Gait Details: follows cues for precautions very well  Stairs Stairs: Yes   Stair Management: Step to pattern, With crutches Number of Stairs: 6 General stair comments: prefernce for crutches given past experiences  Wheelchair Mobility    Modified Rankin (Stroke Patients Only)       Balance Overall balance assessment: Modified Independent                                           Pertinent Vitals/Pain      Home Living Family/patient expects to be discharged to:: Private residence Living Arrangements: Spouse/significant other Available Help at Discharge: Available 24 hours/day Type of Home: House Home Access: Stairs to enter Entrance Stairs-Rails: None Entrance Stairs-Number of Steps: 2 Alternate Level Stairs-Number of Steps: 13 Home Layout: Two level;1/2 bath on main level Home Equipment:  (CPM, loftstrand crutches, AC one is missing a foot)      Prior Function                       Hand Dominance        Extremity/Trunk Assessment                Communication      Cognition  General Comments      Exercises     Assessment/Plan    PT Assessment Patient needs continued PT services  PT Problem List Decreased strength;Decreased range of motion;Decreased activity tolerance;Decreased balance;Decreased mobility;Decreased cognition;Decreased knowledge of use of DME       PT Treatment Interventions DME instruction;Balance training;Gait training;Stair training;Functional mobility training;Therapeutic activities;Therapeutic exercise    PT Goals (Current goals can be found in the Care Plan section)  Acute Rehab PT Goals Patient Stated Goal: go home and walk PT Goal Formulation: With patient Time For Goal  Achievement: 12/14/21 Potential to Achieve Goals: Good    Frequency 7X/week     Co-evaluation               AM-PAC PT "6 Clicks" Mobility  Outcome Measure Help needed turning from your back to your side while in a flat bed without using bedrails?: None Help needed moving from lying on your back to sitting on the side of a flat bed without using bedrails?: None Help needed moving to and from a bed to a chair (including a wheelchair)?: None Help needed standing up from a chair using your arms (e.g., wheelchair or bedside chair)?: None Help needed to walk in hospital room?: None Help needed climbing 3-5 steps with a railing? : A Little 6 Click Score: 23    End of Session   Activity Tolerance: Patient tolerated treatment well;No increased pain Patient left: in chair;with family/visitor present;with call bell/phone within reach Nurse Communication: Mobility status PT Visit Diagnosis: Unsteadiness on feet (R26.81);Difficulty in walking, not elsewhere classified (R26.2);Other abnormalities of gait and mobility (R26.89)    Time: 0258-5277 PT Time Calculation (min) (ACUTE ONLY): 45 min   Charges:   PT Evaluation $PT Eval Moderate Complexity: 1 Mod PT Treatments $Gait Training: 8-22 mins $Therapeutic Activity: 23-37 mins       12:38 PM, 11/30/21 Rosamaria Lints, PT, DPT Physical Therapist - [facility]  [phone] (ASCOM)    Kashton Mcartor C 11/30/2021, 12:34 PM

## 2021-11-30 NOTE — Progress Notes (Signed)
  Subjective: 1 Day Post-Op Procedure(s) (LRB): ARTHROSCOPY HIP, LABRAL REPAIR (Right) Patient reports pain as mild to moderate Patient is well, and has had no acute complaints or problems Plan is to go Home after hospital stay. Negative for chest pain and shortness of breath Fever: no Gastrointestinal: Negative for nausea and vomiting  Objective: Vital signs in last 24 hours: Temp:  [97.5 F (36.4 C)-98.6 F (37 C)] 98.6 F (37 C) (10/11 0501) Pulse Rate:  [54-81] 60 (10/11 0501) Resp:  [10-22] 17 (10/11 0501) BP: (83-113)/(59-76) 107/73 (10/11 0501) SpO2:  [94 %-99 %] 95 % (10/11 0501)  Intake/Output from previous day:  Intake/Output Summary (Last 24 hours) at 11/30/2021 0710 Last data filed at 11/30/2021 0129 Gross per 24 hour  Intake 2627.3 ml  Output 2160 ml  Net 467.3 ml    Intake/Output this shift: No intake/output data recorded.  Labs: No results for input(s): "HGB" in the last 72 hours. No results for input(s): "WBC", "RBC", "HCT", "PLT" in the last 72 hours. No results for input(s): "NA", "K", "CL", "CO2", "BUN", "CREATININE", "GLUCOSE", "CALCIUM" in the last 72 hours. No results for input(s): "LABPT", "INR" in the last 72 hours.   EXAM General - Patient is Alert and Oriented Extremity - Neurovascular intact Sensation intact distally Dorsiflexion/Plantar flexion intact Dressing/Incision - clean, dry, no drainage, with the hip abduction in place.  No signs of compartment syndrome. Motor Function - intact, moving foot and toes well on exam.   Past Medical History:  Diagnosis Date   Anxiety    Bronchitis    Carpal tunnel syndrome of right wrist 2019   Femoroacetabular impingement    Headache    PONV (postoperative nausea and vomiting)     Assessment/Plan: 1 Day Post-Op Procedure(s) (LRB): ARTHROSCOPY HIP, LABRAL REPAIR (Right) Principal Problem:   Femoroacetabular impingement  Estimated body mass index is 25.61 kg/m as calculated from the  following:   Height as of this encounter: 5\' 2"  (1.575 m).   Weight as of this encounter: 63.5 kg. Advance diet Up with therapy D/C IV fluids  DVT Prophylaxis - Aspirin Flatfoot Weight-Bearing in the abduction brace on right leg  [name], PA-C Orthopaedic Surgery 11/30/2021, 7:10 AM

## 2021-11-30 NOTE — Progress Notes (Signed)
Patient is not able to walk the distance required to go the bathroom, or he/she is unable to safely negotiate stairs required to access the bathroom.  A 3in1 BSC will alleviate this problem  

## 2021-11-30 NOTE — Plan of Care (Signed)
  Problem: Education: Goal: Knowledge of General Education information will improve Description: Including pain rating scale, medication(s)/side effects and non-pharmacologic comfort measures Outcome: Progressing   Problem: Activity: Goal: Risk for activity intolerance will decrease Outcome: Progressing   Problem: Elimination: Goal: Will not experience complications related to bowel motility Outcome: Progressing   Problem: Coping: Goal: Level of anxiety will decrease Outcome: Progressing   Problem: Safety: Goal: Ability to remain free from injury will improve Outcome: Progressing   Problem: Skin Integrity: Goal: Risk for impaired skin integrity will decrease Outcome: Progressing

## 2021-11-30 NOTE — Evaluation (Signed)
Occupational Therapy Evaluation Patient Details Name: Alison Owens MRN: [id number] DOB: 06-Feb-1981 Today's Date: 11/30/2021   History of Present Illness Alison Owens comes to [facility] on 10/10 to see Dr. Allena Katz in the OR for FAI issues. Postop pt is FFWB/TDWB in a ABDCT brace Rt.   Clinical Impression   Patient presenting with decreased independence in self-care, functional mobility, and safety. Patient and husband report that she has 24/7 assistance at home (husband works from home). Lives in a 2 story home and plans to live on 2nd floor. Independent at baseline.  Patient currently functioning at min A  for LB dressing, set up for UB dressing, and supervision for transfers using RW. Patient was educated on flatfoot WB abduction brace usage, touch down weightbearing precautions, LB dressing techniques, and usage of the polar care system. Patient was able to verbalize and demonstrate understanding; donned and doffed brace with min A.  RW and BSC recommended. No OT recommended at this time. OT to complete order.      Recommendations for follow up therapy are one component of a multi-disciplinary discharge planning process, led by the attending physician.  Recommendations may be updated based on patient status, additional functional criteria and insurance authorization.   Follow Up Recommendations  No OT follow up    Assistance Recommended at Discharge Set up Supervision/Assistance  Patient can return home with the following A little help with bathing/dressing/bathroom;Help with stairs or ramp for entrance;Assist for transportation;Assistance with cooking/housework    Functional Status Assessment  Patient has had a recent decline in their functional status and demonstrates the ability to make significant improvements in function in a reasonable and predictable amount of time.  Equipment Recommendations  BSC/3in1;Other (comment) (RW)    Recommendations for Other Services       Precautions /  Restrictions Precautions Precautions: Fall Restrictions Weight Bearing Restrictions: Yes RLE Weight Bearing: Touchdown weight bearing      Mobility Bed Mobility               General bed mobility comments: Patient in recliner upon arrival.    Transfers Overall transfer level: Modified independent Equipment used: Rolling walker (2 wheels)                      Balance Overall balance assessment: Modified Independent                                         ADL either performed or assessed with clinical judgement   ADL Overall ADL's : Needs assistance/impaired                 Upper Body Dressing : Set up   Lower Body Dressing: Minimal assistance                       Vision Patient Visual Report: No change from baseline              Pertinent Vitals/Pain Pain Assessment Pain Assessment: No/denies pain        Extremity/Trunk Assessment Upper Extremity Assessment Upper Extremity Assessment: Overall WFL for tasks assessed   Lower Extremity Assessment Lower Extremity Assessment: Generalized weakness   Cervical / Trunk Assessment Cervical / Trunk Assessment: Normal   Communication Communication Communication: No difficulties   Cognition Arousal/Alertness: Awake/alert Behavior During Therapy: WFL for tasks assessed/performed Overall Cognitive Status: Within Functional Limits  for tasks assessed                                                  Home Living Family/patient expects to be discharged to:: Private residence Living Arrangements: Spouse/significant other;Children Available Help at Discharge: Available 24 hours/day Type of Home: House Home Access: Stairs to enter [employer] of Steps: 2 Entrance Stairs-Rails: None Home Layout: Two level;1/2 bath on main level Alternate Level Stairs-Number of Steps: 13 Alternate Level Stairs-Rails: Right Bathroom Shower/Tub: Emergency planning/management officer: Standard     Home Equipment:  (CPM, loftstrand crutches, AC one is missing a foot)          Prior Functioning/Environment Prior Level of Function : Independent/Modified Independent;Driving                                 OT Goals(Current goals can be found in the care plan section) Acute Rehab OT Goals Patient Stated Goal: to return home. OT Goal Formulation: With patient/family Time For Goal Achievement: 11/30/21 Potential to Achieve Goals: Good         AM-PAC OT "6 Clicks" Daily Activity     Outcome Measure Help from another person eating meals?: None Help from another person taking care of personal grooming?: None Help from another person toileting, which includes using toliet, bedpan, or urinal?: A Little Help from another person bathing (including washing, rinsing, drying)?: A Little Help from another person to put on and taking off regular upper body clothing?: None Help from another person to put on and taking off regular lower body clothing?: A Little 6 Click Score: 21   End of Session Equipment Utilized During Treatment: Rolling walker (2 wheels) Nurse Communication: Mobility status  Activity Tolerance: Patient tolerated treatment well Patient left: in chair;with call bell/phone within reach;with family/visitor present                   Time: 1122-1140 OT Time Calculation (min): 18 min Charges:       Tomasa Blase, OTS 11/30/2021, 1:08 PM

## 2024-01-21 ENCOUNTER — Other Ambulatory Visit: Payer: Self-pay | Admitting: Orthopedic Surgery

## 2024-01-21 DIAGNOSIS — M25851 Other specified joint disorders, right hip: Secondary | ICD-10-CM

## 2024-01-21 DIAGNOSIS — M25551 Pain in right hip: Secondary | ICD-10-CM

## 2024-01-22 ENCOUNTER — Ambulatory Visit
Admission: RE | Admit: 2024-01-22 | Discharge: 2024-01-22 | Disposition: A | Payer: Self-pay | Source: Ambulatory Visit | Attending: Orthopedic Surgery

## 2024-01-22 ENCOUNTER — Inpatient Hospital Stay: Admission: RE | Admit: 2024-01-22 | Discharge: 2024-01-22 | Payer: Self-pay | Attending: Orthopedic Surgery

## 2024-01-22 DIAGNOSIS — M25851 Other specified joint disorders, right hip: Secondary | ICD-10-CM

## 2024-01-22 DIAGNOSIS — M1611 Unilateral primary osteoarthritis, right hip: Secondary | ICD-10-CM | POA: Diagnosis not present

## 2024-01-22 DIAGNOSIS — M25551 Pain in right hip: Secondary | ICD-10-CM

## 2024-01-22 MED ORDER — GADOBUTROL 1 MMOL/ML IV SOLN
0.0500 mL | Freq: Once | INTRAVENOUS | Status: AC | PRN
  Administered 2024-01-22: 0.05 mL

## 2024-01-22 MED ORDER — LIDOCAINE 1 % OPTIME INJ - NO CHARGE
5.0000 mL | Freq: Once | INTRAMUSCULAR | Status: AC
  Administered 2024-01-22: 5 mL via INTRADERMAL
  Filled 2024-01-22: qty 6

## 2024-01-22 MED ORDER — IOHEXOL 180 MG/ML  SOLN
15.0000 mL | Freq: Once | INTRAMUSCULAR | Status: AC | PRN
  Administered 2024-01-22: 15 mL

## 2024-01-22 MED ORDER — SODIUM CHLORIDE (PF) 0.9% IJ SOLUTION - NO CHARGE
5.0000 mL | INTRAMUSCULAR | Status: DC | PRN
  Administered 2024-01-22: 5 mL
  Filled 2024-01-22 (×2): qty 6
